# Patient Record
Sex: Male | Born: 1935 | Race: Black or African American | Hispanic: No | Smoking: Former smoker
Health system: Southern US, Community
[De-identification: ages and names within clinical notes are randomized; demographics above are authoritative.]

## PROBLEM LIST (undated history)

## (undated) DIAGNOSIS — I6529 Occlusion and stenosis of unspecified carotid artery: Secondary | ICD-10-CM

## (undated) DIAGNOSIS — I452 Bifascicular block: Secondary | ICD-10-CM

## (undated) DIAGNOSIS — I10 Essential (primary) hypertension: Secondary | ICD-10-CM

## (undated) DIAGNOSIS — N189 Chronic kidney disease, unspecified: Secondary | ICD-10-CM

## (undated) DIAGNOSIS — H3530 Unspecified macular degeneration: Secondary | ICD-10-CM

## (undated) DIAGNOSIS — I639 Cerebral infarction, unspecified: Secondary | ICD-10-CM

## (undated) DIAGNOSIS — N400 Enlarged prostate without lower urinary tract symptoms: Secondary | ICD-10-CM

## (undated) DIAGNOSIS — H269 Unspecified cataract: Secondary | ICD-10-CM

## (undated) HISTORY — DX: Cerebral infarction, unspecified: I63.9

## (undated) HISTORY — DX: Occlusion and stenosis of unspecified carotid artery: I65.29

---

## 1999-11-15 ENCOUNTER — Ambulatory Visit: Admission: RE | Admit: 1999-11-15 | Discharge: 1999-11-15 | Payer: Self-pay | Admitting: *Deleted

## 2002-12-07 ENCOUNTER — Ambulatory Visit (HOSPITAL_COMMUNITY): Admission: RE | Admit: 2002-12-07 | Discharge: 2002-12-07 | Payer: Self-pay | Admitting: *Deleted

## 2011-08-11 ENCOUNTER — Ambulatory Visit (INDEPENDENT_AMBULATORY_CARE_PROVIDER_SITE_OTHER): Payer: Medicare Other | Admitting: Ophthalmology

## 2011-08-11 DIAGNOSIS — H3530 Unspecified macular degeneration: Secondary | ICD-10-CM

## 2011-08-11 DIAGNOSIS — H2510 Age-related nuclear cataract, unspecified eye: Secondary | ICD-10-CM

## 2011-08-11 DIAGNOSIS — H43819 Vitreous degeneration, unspecified eye: Secondary | ICD-10-CM

## 2012-08-11 ENCOUNTER — Ambulatory Visit (INDEPENDENT_AMBULATORY_CARE_PROVIDER_SITE_OTHER): Payer: Medicare Other | Admitting: Ophthalmology

## 2012-08-11 DIAGNOSIS — H43819 Vitreous degeneration, unspecified eye: Secondary | ICD-10-CM

## 2012-08-11 DIAGNOSIS — H2510 Age-related nuclear cataract, unspecified eye: Secondary | ICD-10-CM

## 2012-08-11 DIAGNOSIS — H3530 Unspecified macular degeneration: Secondary | ICD-10-CM

## 2013-08-29 ENCOUNTER — Ambulatory Visit (INDEPENDENT_AMBULATORY_CARE_PROVIDER_SITE_OTHER): Payer: Medicare Other | Admitting: Ophthalmology

## 2013-08-29 DIAGNOSIS — H35039 Hypertensive retinopathy, unspecified eye: Secondary | ICD-10-CM

## 2013-08-29 DIAGNOSIS — H3530 Unspecified macular degeneration: Secondary | ICD-10-CM

## 2013-08-29 DIAGNOSIS — H2510 Age-related nuclear cataract, unspecified eye: Secondary | ICD-10-CM

## 2013-08-29 DIAGNOSIS — I10 Essential (primary) hypertension: Secondary | ICD-10-CM

## 2013-08-29 DIAGNOSIS — H43819 Vitreous degeneration, unspecified eye: Secondary | ICD-10-CM

## 2014-06-28 ENCOUNTER — Encounter (INDEPENDENT_AMBULATORY_CARE_PROVIDER_SITE_OTHER): Payer: Medicare Other | Admitting: Ophthalmology

## 2014-06-28 DIAGNOSIS — H3532 Exudative age-related macular degeneration: Secondary | ICD-10-CM | POA: Diagnosis not present

## 2014-06-28 DIAGNOSIS — H43813 Vitreous degeneration, bilateral: Secondary | ICD-10-CM

## 2014-06-28 DIAGNOSIS — H35033 Hypertensive retinopathy, bilateral: Secondary | ICD-10-CM | POA: Diagnosis not present

## 2014-06-28 DIAGNOSIS — I10 Essential (primary) hypertension: Secondary | ICD-10-CM

## 2014-06-28 DIAGNOSIS — H3531 Nonexudative age-related macular degeneration: Secondary | ICD-10-CM

## 2014-07-24 ENCOUNTER — Encounter (INDEPENDENT_AMBULATORY_CARE_PROVIDER_SITE_OTHER): Payer: Medicare Other | Admitting: Ophthalmology

## 2014-07-24 DIAGNOSIS — I10 Essential (primary) hypertension: Secondary | ICD-10-CM | POA: Diagnosis not present

## 2014-07-24 DIAGNOSIS — H35033 Hypertensive retinopathy, bilateral: Secondary | ICD-10-CM | POA: Diagnosis not present

## 2014-07-24 DIAGNOSIS — H3532 Exudative age-related macular degeneration: Secondary | ICD-10-CM

## 2014-07-24 DIAGNOSIS — H3531 Nonexudative age-related macular degeneration: Secondary | ICD-10-CM | POA: Diagnosis not present

## 2014-07-24 DIAGNOSIS — H43813 Vitreous degeneration, bilateral: Secondary | ICD-10-CM | POA: Diagnosis not present

## 2014-08-21 ENCOUNTER — Encounter (INDEPENDENT_AMBULATORY_CARE_PROVIDER_SITE_OTHER): Payer: Medicare Other | Admitting: Ophthalmology

## 2014-08-21 DIAGNOSIS — H43813 Vitreous degeneration, bilateral: Secondary | ICD-10-CM | POA: Diagnosis not present

## 2014-08-21 DIAGNOSIS — I10 Essential (primary) hypertension: Secondary | ICD-10-CM | POA: Diagnosis not present

## 2014-08-21 DIAGNOSIS — H35033 Hypertensive retinopathy, bilateral: Secondary | ICD-10-CM | POA: Diagnosis not present

## 2014-08-21 DIAGNOSIS — H3531 Nonexudative age-related macular degeneration: Secondary | ICD-10-CM | POA: Diagnosis not present

## 2014-08-21 DIAGNOSIS — H3532 Exudative age-related macular degeneration: Secondary | ICD-10-CM | POA: Diagnosis not present

## 2014-09-11 ENCOUNTER — Ambulatory Visit (INDEPENDENT_AMBULATORY_CARE_PROVIDER_SITE_OTHER): Payer: Medicare Other | Admitting: Ophthalmology

## 2014-09-20 ENCOUNTER — Encounter (INDEPENDENT_AMBULATORY_CARE_PROVIDER_SITE_OTHER): Payer: Medicare Other | Admitting: Ophthalmology

## 2014-09-20 DIAGNOSIS — H43813 Vitreous degeneration, bilateral: Secondary | ICD-10-CM | POA: Diagnosis not present

## 2014-09-20 DIAGNOSIS — I10 Essential (primary) hypertension: Secondary | ICD-10-CM

## 2014-09-20 DIAGNOSIS — H35033 Hypertensive retinopathy, bilateral: Secondary | ICD-10-CM

## 2014-09-20 DIAGNOSIS — H3532 Exudative age-related macular degeneration: Secondary | ICD-10-CM | POA: Diagnosis not present

## 2014-09-20 DIAGNOSIS — H3531 Nonexudative age-related macular degeneration: Secondary | ICD-10-CM | POA: Diagnosis not present

## 2014-10-18 ENCOUNTER — Encounter (INDEPENDENT_AMBULATORY_CARE_PROVIDER_SITE_OTHER): Payer: Medicare Other | Admitting: Ophthalmology

## 2014-10-18 DIAGNOSIS — I10 Essential (primary) hypertension: Secondary | ICD-10-CM

## 2014-10-18 DIAGNOSIS — H3532 Exudative age-related macular degeneration: Secondary | ICD-10-CM | POA: Diagnosis not present

## 2014-10-18 DIAGNOSIS — H3531 Nonexudative age-related macular degeneration: Secondary | ICD-10-CM | POA: Diagnosis not present

## 2014-10-18 DIAGNOSIS — H43813 Vitreous degeneration, bilateral: Secondary | ICD-10-CM

## 2014-10-18 DIAGNOSIS — H35033 Hypertensive retinopathy, bilateral: Secondary | ICD-10-CM | POA: Diagnosis not present

## 2014-11-29 ENCOUNTER — Encounter (INDEPENDENT_AMBULATORY_CARE_PROVIDER_SITE_OTHER): Payer: Medicare Other | Admitting: Ophthalmology

## 2014-11-29 DIAGNOSIS — H35033 Hypertensive retinopathy, bilateral: Secondary | ICD-10-CM | POA: Diagnosis not present

## 2014-11-29 DIAGNOSIS — H3531 Nonexudative age-related macular degeneration: Secondary | ICD-10-CM | POA: Diagnosis not present

## 2014-11-29 DIAGNOSIS — H43813 Vitreous degeneration, bilateral: Secondary | ICD-10-CM

## 2014-11-29 DIAGNOSIS — H3532 Exudative age-related macular degeneration: Secondary | ICD-10-CM | POA: Diagnosis not present

## 2014-11-29 DIAGNOSIS — I10 Essential (primary) hypertension: Secondary | ICD-10-CM

## 2015-01-10 ENCOUNTER — Encounter (INDEPENDENT_AMBULATORY_CARE_PROVIDER_SITE_OTHER): Payer: Medicare Other | Admitting: Ophthalmology

## 2015-01-10 DIAGNOSIS — H43813 Vitreous degeneration, bilateral: Secondary | ICD-10-CM | POA: Diagnosis not present

## 2015-01-10 DIAGNOSIS — I10 Essential (primary) hypertension: Secondary | ICD-10-CM | POA: Diagnosis not present

## 2015-01-10 DIAGNOSIS — H35033 Hypertensive retinopathy, bilateral: Secondary | ICD-10-CM | POA: Diagnosis not present

## 2015-01-10 DIAGNOSIS — H353231 Exudative age-related macular degeneration, bilateral, with active choroidal neovascularization: Secondary | ICD-10-CM

## 2015-02-21 ENCOUNTER — Encounter (INDEPENDENT_AMBULATORY_CARE_PROVIDER_SITE_OTHER): Payer: Medicare Other | Admitting: Ophthalmology

## 2015-02-21 DIAGNOSIS — H35033 Hypertensive retinopathy, bilateral: Secondary | ICD-10-CM | POA: Diagnosis not present

## 2015-02-21 DIAGNOSIS — H43813 Vitreous degeneration, bilateral: Secondary | ICD-10-CM | POA: Diagnosis not present

## 2015-02-21 DIAGNOSIS — H353231 Exudative age-related macular degeneration, bilateral, with active choroidal neovascularization: Secondary | ICD-10-CM

## 2015-02-21 DIAGNOSIS — I10 Essential (primary) hypertension: Secondary | ICD-10-CM | POA: Diagnosis not present

## 2015-04-04 ENCOUNTER — Encounter (INDEPENDENT_AMBULATORY_CARE_PROVIDER_SITE_OTHER): Payer: Medicare Other | Admitting: Ophthalmology

## 2015-04-04 DIAGNOSIS — H35033 Hypertensive retinopathy, bilateral: Secondary | ICD-10-CM | POA: Diagnosis not present

## 2015-04-04 DIAGNOSIS — I10 Essential (primary) hypertension: Secondary | ICD-10-CM

## 2015-04-04 DIAGNOSIS — H43813 Vitreous degeneration, bilateral: Secondary | ICD-10-CM

## 2015-04-04 DIAGNOSIS — H353231 Exudative age-related macular degeneration, bilateral, with active choroidal neovascularization: Secondary | ICD-10-CM | POA: Diagnosis not present

## 2015-05-16 ENCOUNTER — Encounter (INDEPENDENT_AMBULATORY_CARE_PROVIDER_SITE_OTHER): Payer: Medicare Other | Admitting: Ophthalmology

## 2015-05-16 DIAGNOSIS — I10 Essential (primary) hypertension: Secondary | ICD-10-CM | POA: Diagnosis not present

## 2015-05-16 DIAGNOSIS — H353231 Exudative age-related macular degeneration, bilateral, with active choroidal neovascularization: Secondary | ICD-10-CM

## 2015-05-16 DIAGNOSIS — H43813 Vitreous degeneration, bilateral: Secondary | ICD-10-CM | POA: Diagnosis not present

## 2015-05-16 DIAGNOSIS — H35033 Hypertensive retinopathy, bilateral: Secondary | ICD-10-CM

## 2015-06-27 ENCOUNTER — Encounter (INDEPENDENT_AMBULATORY_CARE_PROVIDER_SITE_OTHER): Payer: Medicare Other | Admitting: Ophthalmology

## 2015-06-27 DIAGNOSIS — H43813 Vitreous degeneration, bilateral: Secondary | ICD-10-CM

## 2015-06-27 DIAGNOSIS — H353231 Exudative age-related macular degeneration, bilateral, with active choroidal neovascularization: Secondary | ICD-10-CM

## 2015-06-27 DIAGNOSIS — H35033 Hypertensive retinopathy, bilateral: Secondary | ICD-10-CM

## 2015-06-27 DIAGNOSIS — I10 Essential (primary) hypertension: Secondary | ICD-10-CM

## 2015-08-01 ENCOUNTER — Encounter: Payer: Self-pay | Admitting: Podiatry

## 2015-08-01 ENCOUNTER — Ambulatory Visit (INDEPENDENT_AMBULATORY_CARE_PROVIDER_SITE_OTHER): Payer: Medicare Other | Admitting: Podiatry

## 2015-08-01 VITALS — BP 119/68 | HR 73 | Resp 14

## 2015-08-01 DIAGNOSIS — B351 Tinea unguium: Secondary | ICD-10-CM

## 2015-08-01 DIAGNOSIS — M79676 Pain in unspecified toe(s): Secondary | ICD-10-CM

## 2015-08-01 NOTE — Progress Notes (Signed)
   Subjective:    Patient ID: Christian BrazenJohn W Barner, male    DOB: 01/15/1924, 82 y.o.   MRN: [id number]  HPI this patient presents to my office with chief complaint of long thick painful toenails, especially his big toenails both feet. He states that his nails are painful walking and wearing his shoes. He mentioned to his primary doctor that he was having difficulty taking care of his nails and he referred him to this office for an evaluation. Patient does have a history of surgery on the second toe, left foot. He also has chronic athlete's foot. According to this patient. He presents to the office today for an evaluation and treatment of his feet    Review of Systems  All other systems reviewed and are negative.      Objective:   Physical Exam GENERAL APPEARANCE: Alert, conversant. Appropriately groomed. No acute distress.  VASCULAR: Pedal pulses are  Diminished  at  DP and PT bilateral.  Capillary refill time is immediate to all digits,  Normal temperature gradient.  Digital hair growth is present bilateral  NEUROLOGIC: sensation is normal to 5.07 monofilament at 5/5 sites bilateral.  Light touch is intact bilateral, Muscle strength normal.  MUSCULOSKELETAL: acceptable muscle strength, tone and stability bilateral.  Intrinsic muscluature intact bilateral.  Rectus appearance of foot and digits noted bilateral. Bone from second toe left foot removed.  DERMATOLOGIC: skin color, texture, and turgor are within normal limits.  No preulcerative lesions or ulcers  are seen, no interdigital maceration noted.  No open lesions present.  . No drainage noted.  NAILS  Thick disfigured discolored nails both feet.  No redness or drainage noted.         Assessment & Plan:  Onychomycosis B/L   IE  Debridement of nails B/l  RTC 3 months   Helane GuntherGregory Erynne Kealey DPM

## 2015-08-15 ENCOUNTER — Encounter (INDEPENDENT_AMBULATORY_CARE_PROVIDER_SITE_OTHER): Payer: Medicare Other | Admitting: Ophthalmology

## 2015-08-15 DIAGNOSIS — I10 Essential (primary) hypertension: Secondary | ICD-10-CM | POA: Diagnosis not present

## 2015-08-15 DIAGNOSIS — H43813 Vitreous degeneration, bilateral: Secondary | ICD-10-CM

## 2015-08-15 DIAGNOSIS — H353231 Exudative age-related macular degeneration, bilateral, with active choroidal neovascularization: Secondary | ICD-10-CM

## 2015-08-15 DIAGNOSIS — H35033 Hypertensive retinopathy, bilateral: Secondary | ICD-10-CM | POA: Diagnosis not present

## 2015-10-10 ENCOUNTER — Encounter (INDEPENDENT_AMBULATORY_CARE_PROVIDER_SITE_OTHER): Payer: Medicare Other | Admitting: Ophthalmology

## 2015-10-10 DIAGNOSIS — H43813 Vitreous degeneration, bilateral: Secondary | ICD-10-CM | POA: Diagnosis not present

## 2015-10-10 DIAGNOSIS — H35033 Hypertensive retinopathy, bilateral: Secondary | ICD-10-CM | POA: Diagnosis not present

## 2015-10-10 DIAGNOSIS — H353231 Exudative age-related macular degeneration, bilateral, with active choroidal neovascularization: Secondary | ICD-10-CM

## 2015-10-10 DIAGNOSIS — I10 Essential (primary) hypertension: Secondary | ICD-10-CM | POA: Diagnosis not present

## 2015-10-31 ENCOUNTER — Ambulatory Visit (INDEPENDENT_AMBULATORY_CARE_PROVIDER_SITE_OTHER): Payer: Medicare Other | Admitting: Podiatry

## 2015-10-31 ENCOUNTER — Encounter: Payer: Self-pay | Admitting: Podiatry

## 2015-10-31 DIAGNOSIS — B351 Tinea unguium: Secondary | ICD-10-CM | POA: Diagnosis not present

## 2015-10-31 DIAGNOSIS — M79676 Pain in unspecified toe(s): Secondary | ICD-10-CM | POA: Diagnosis not present

## 2015-10-31 NOTE — Progress Notes (Signed)
Complaint:  Visit Type: Patient returns to my office for continued preventative foot care services. Complaint: Patient states" my nails have grown long and thick and become painful to walk and wear shoes" . The patient presents for preventative foot care services. No changes to ROS  Podiatric Exam: Vascular: dorsalis pedis and posterior tibial pulses are palpable bilateral. Capillary return is immediate. Temperature gradient is WNL. Skin turgor WNL  Sensorium: Normal Semmes Weinstein monofilament test. Normal tactile sensation bilaterally. Nail Exam: Pt has thick disfigured discolored nails with subungual debris noted bilateral entire nail hallux through fifth toenails Ulcer Exam: There is no evidence of ulcer or pre-ulcerative changes or infection. Orthopedic Exam: Muscle tone and strength are WNL. No limitations in general ROM. No crepitus or effusions noted. Foot type and digits show no abnormalities. Bony prominences are unremarkable. Skin: No Porokeratosis. No infection or ulcers  Diagnosis:  Onychomycosis, , Pain in right toe, pain in left toes  Treatment & Plan Procedures and Treatment: Consent by patient was obtained for treatment procedures. The patient understood the discussion of treatment and procedures well. All questions were answered thoroughly reviewed. Debridement of mycotic and hypertrophic toenails, 1 through 5 bilateral and clearing of subungual debris. No ulceration, no infection noted.  Return Visit-Office Procedure: Patient instructed to return to the office for a follow up visit 3 months   for continued evaluation and treatment.    Uziel Covault DPM 

## 2015-12-05 ENCOUNTER — Encounter (INDEPENDENT_AMBULATORY_CARE_PROVIDER_SITE_OTHER): Payer: Medicare Other | Admitting: Ophthalmology

## 2015-12-05 DIAGNOSIS — H353231 Exudative age-related macular degeneration, bilateral, with active choroidal neovascularization: Secondary | ICD-10-CM | POA: Diagnosis not present

## 2015-12-05 DIAGNOSIS — H35033 Hypertensive retinopathy, bilateral: Secondary | ICD-10-CM

## 2015-12-05 DIAGNOSIS — H43813 Vitreous degeneration, bilateral: Secondary | ICD-10-CM

## 2015-12-05 DIAGNOSIS — I10 Essential (primary) hypertension: Secondary | ICD-10-CM

## 2016-01-30 ENCOUNTER — Encounter (INDEPENDENT_AMBULATORY_CARE_PROVIDER_SITE_OTHER): Payer: Medicare Other | Admitting: Ophthalmology

## 2016-01-30 ENCOUNTER — Ambulatory Visit: Payer: Medicare Other | Admitting: Podiatry

## 2016-01-30 DIAGNOSIS — H353231 Exudative age-related macular degeneration, bilateral, with active choroidal neovascularization: Secondary | ICD-10-CM

## 2016-01-30 DIAGNOSIS — H43813 Vitreous degeneration, bilateral: Secondary | ICD-10-CM | POA: Diagnosis not present

## 2016-01-30 DIAGNOSIS — H35033 Hypertensive retinopathy, bilateral: Secondary | ICD-10-CM

## 2016-01-30 DIAGNOSIS — I10 Essential (primary) hypertension: Secondary | ICD-10-CM

## 2016-02-07 ENCOUNTER — Encounter: Payer: Self-pay | Admitting: Podiatry

## 2016-02-07 ENCOUNTER — Ambulatory Visit (INDEPENDENT_AMBULATORY_CARE_PROVIDER_SITE_OTHER): Payer: Medicare Other | Admitting: Podiatry

## 2016-02-07 VITALS — Ht 70.0 in | Wt 172.0 lb

## 2016-02-07 DIAGNOSIS — M79676 Pain in unspecified toe(s): Secondary | ICD-10-CM | POA: Diagnosis not present

## 2016-02-07 DIAGNOSIS — B351 Tinea unguium: Secondary | ICD-10-CM | POA: Diagnosis not present

## 2016-02-07 DIAGNOSIS — W450XXA Nail entering through skin, initial encounter: Secondary | ICD-10-CM

## 2016-02-07 NOTE — Progress Notes (Signed)
Complaint:  Visit Type: Patient returns to my office for continued preventative foot care services. Complaint: Patient states" my nails have grown long and thick and become painful to walk and wear shoes" . The patient presents for preventative foot care services. No changes to ROS.  Patient says he injures his left big toenail 2 weeks ago.  Podiatric Exam: Vascular: dorsalis pedis and posterior tibial pulses are palpable bilateral. Capillary return is immediate. Temperature gradient is WNL. Skin turgor WNL  Sensorium: Normal Semmes Weinstein monofilament test. Normal tactile sensation bilaterally. Nail Exam: Pt has thick disfigured discolored nails with subungual debris noted bilateral entire nail hallux through fifth toenails.  There is blackened hematoma left hallux nail with nail lifting but intact proxinmal nail fold.  No redness or infection or drainage. Ulcer Exam: There is no evidence of ulcer or pre-ulcerative changes or infection. Orthopedic Exam: Muscle tone and strength are WNL. No limitations in general ROM. No crepitus or effusions noted. Foot type and digits show no abnormalities. Bony prominences are unremarkable. Skin: No Porokeratosis. No infection or ulcers  Diagnosis:  Onychomycosis, , Pain in right toe, pain in left toes  Treatment & Plan Procedures and Treatment: Consent by patient was obtained for treatment procedures. The patient understood the discussion of treatment and procedures well. All questions were answered thoroughly reviewed. Debridement of mycotic and hypertrophic toenails, 1 through 5 bilateral and clearing of subungual debris. No ulceration, no infection noted.  Return Visit-Office Procedure: Patient instructed to return to the office for a follow up visit 3 months for continued evaluation and treatment.    Helane GuntherGregory Chrissie Dacquisto DPM

## 2016-03-26 ENCOUNTER — Encounter (INDEPENDENT_AMBULATORY_CARE_PROVIDER_SITE_OTHER): Payer: Medicare Other | Admitting: Ophthalmology

## 2016-03-26 DIAGNOSIS — H353231 Exudative age-related macular degeneration, bilateral, with active choroidal neovascularization: Secondary | ICD-10-CM

## 2016-03-26 DIAGNOSIS — H35033 Hypertensive retinopathy, bilateral: Secondary | ICD-10-CM

## 2016-03-26 DIAGNOSIS — I10 Essential (primary) hypertension: Secondary | ICD-10-CM | POA: Diagnosis not present

## 2016-03-26 DIAGNOSIS — H43813 Vitreous degeneration, bilateral: Secondary | ICD-10-CM

## 2016-05-01 ENCOUNTER — Encounter: Payer: Self-pay | Admitting: Podiatry

## 2016-05-01 ENCOUNTER — Ambulatory Visit (INDEPENDENT_AMBULATORY_CARE_PROVIDER_SITE_OTHER): Payer: Medicare Other | Admitting: Podiatry

## 2016-05-01 VITALS — Ht 70.0 in | Wt 172.0 lb

## 2016-05-01 DIAGNOSIS — B351 Tinea unguium: Secondary | ICD-10-CM | POA: Diagnosis not present

## 2016-05-01 DIAGNOSIS — M79676 Pain in unspecified toe(s): Secondary | ICD-10-CM | POA: Diagnosis not present

## 2016-05-01 NOTE — Progress Notes (Signed)
Complaint:  Visit Type: Patient returns to my office for continued preventative foot care services. Complaint: Patient states" my nails have grown long and thick and become painful to walk and wear shoes" . The patient presents for preventative foot care services. No changes to ROS.  Patient says he injures his left big toenail 2 weeks ago.  Podiatric Exam: Vascular: dorsalis pedis and posterior tibial pulses are palpable bilateral. Capillary return is immediate. Temperature gradient is WNL. Skin turgor WNL  Sensorium: Normal Semmes Weinstein monofilament test. Normal tactile sensation bilaterally. Nail Exam: Pt has thick disfigured discolored nails with subungual debris noted bilateral entire nail hallux through fifth toenails.  There is blackened hematoma left hallux nail with nail lifting but intact proxinmal nail fold.  No redness or infection or drainage. Ulcer Exam: There is no evidence of ulcer or pre-ulcerative changes or infection. Orthopedic Exam: Muscle tone and strength are WNL. No limitations in general ROM. No crepitus or effusions noted. Foot type and digits show no abnormalities. Bony prominences are unremarkable. Skin: No Porokeratosis. No infection or ulcers  Diagnosis:  Onychomycosis, , Pain in right toe, pain in left toes  Treatment & Plan Procedures and Treatment: Consent by patient was obtained for treatment procedures. The patient understood the discussion of treatment and procedures well. All questions were answered thoroughly reviewed. Debridement of mycotic and hypertrophic toenails, 1 through 5 bilateral and clearing of subungual debris. No ulceration, no infection noted.  Return Visit-Office Procedure: Patient instructed to return to the office for a follow up visit 3 months for continued evaluation and treatment.    Danniela Mcbrearty DPM 

## 2016-05-21 ENCOUNTER — Encounter (INDEPENDENT_AMBULATORY_CARE_PROVIDER_SITE_OTHER): Payer: Medicare Other | Admitting: Ophthalmology

## 2016-05-21 DIAGNOSIS — H43813 Vitreous degeneration, bilateral: Secondary | ICD-10-CM | POA: Diagnosis not present

## 2016-05-21 DIAGNOSIS — I10 Essential (primary) hypertension: Secondary | ICD-10-CM | POA: Diagnosis not present

## 2016-05-21 DIAGNOSIS — H353231 Exudative age-related macular degeneration, bilateral, with active choroidal neovascularization: Secondary | ICD-10-CM

## 2016-05-21 DIAGNOSIS — H35033 Hypertensive retinopathy, bilateral: Secondary | ICD-10-CM | POA: Diagnosis not present

## 2016-07-16 ENCOUNTER — Encounter (INDEPENDENT_AMBULATORY_CARE_PROVIDER_SITE_OTHER): Payer: Medicare Other | Admitting: Ophthalmology

## 2016-07-16 DIAGNOSIS — I10 Essential (primary) hypertension: Secondary | ICD-10-CM

## 2016-07-16 DIAGNOSIS — H43813 Vitreous degeneration, bilateral: Secondary | ICD-10-CM

## 2016-07-16 DIAGNOSIS — H353231 Exudative age-related macular degeneration, bilateral, with active choroidal neovascularization: Secondary | ICD-10-CM

## 2016-07-16 DIAGNOSIS — H35033 Hypertensive retinopathy, bilateral: Secondary | ICD-10-CM | POA: Diagnosis not present

## 2016-08-06 ENCOUNTER — Encounter: Payer: Self-pay | Admitting: Podiatry

## 2016-08-06 ENCOUNTER — Ambulatory Visit (INDEPENDENT_AMBULATORY_CARE_PROVIDER_SITE_OTHER): Payer: Medicare Other | Admitting: Podiatry

## 2016-08-06 DIAGNOSIS — M79676 Pain in unspecified toe(s): Secondary | ICD-10-CM

## 2016-08-06 DIAGNOSIS — B351 Tinea unguium: Secondary | ICD-10-CM

## 2016-08-06 NOTE — Progress Notes (Signed)
Complaint:  Visit Type: Patient returns to my office for continued preventative foot care services. Complaint: Patient states" my nails have grown long and thick and become painful to walk and wear shoes" . The patient presents for preventative foot care services.   Podiatric Exam: Vascular: dorsalis pedis and posterior tibial pulses are palpable bilateral. Capillary return is immediate. Temperature gradient is WNL. Skin turgor WNL  Sensorium: Normal Semmes Weinstein monofilament test. Normal tactile sensation bilaterally. Nail Exam: Pt has thick disfigured discolored nails with subungual debris noted bilateral entire nail hallux through fifth toenails.  There is blackened hematoma left hallux nail with nail lifting but intact proxinmal nail fold.  No redness or infection or drainage. Ulcer Exam: There is no evidence of ulcer or pre-ulcerative changes or infection. Orthopedic Exam: Muscle tone and strength are WNL. No limitations in general ROM. No crepitus or effusions noted. Foot type and digits show no abnormalities. Bony prominences are unremarkable. Skin: No Porokeratosis. No infection or ulcers  Diagnosis:  Onychomycosis, , Pain in right toe, pain in left toes  Treatment & Plan Procedures and Treatment: Consent by patient was obtained for treatment procedures. The patient understood the discussion of treatment and procedures well. All questions were answered thoroughly reviewed. Debridement of mycotic and hypertrophic toenails, 1 through 5 bilateral and clearing of subungual debris. No ulceration, no infection noted.  Return Visit-Office Procedure: Patient instructed to return to the office for a follow up visit 3 months for continued evaluation and treatment.    Mariely Mahr DPM 

## 2016-09-10 ENCOUNTER — Encounter (INDEPENDENT_AMBULATORY_CARE_PROVIDER_SITE_OTHER): Payer: Medicare Other | Admitting: Ophthalmology

## 2016-09-10 DIAGNOSIS — I10 Essential (primary) hypertension: Secondary | ICD-10-CM | POA: Diagnosis not present

## 2016-09-10 DIAGNOSIS — H353231 Exudative age-related macular degeneration, bilateral, with active choroidal neovascularization: Secondary | ICD-10-CM | POA: Diagnosis not present

## 2016-09-10 DIAGNOSIS — H43813 Vitreous degeneration, bilateral: Secondary | ICD-10-CM | POA: Diagnosis not present

## 2016-09-10 DIAGNOSIS — H35033 Hypertensive retinopathy, bilateral: Secondary | ICD-10-CM | POA: Diagnosis not present

## 2016-11-11 ENCOUNTER — Ambulatory Visit (INDEPENDENT_AMBULATORY_CARE_PROVIDER_SITE_OTHER): Payer: Medicare Other | Admitting: Podiatry

## 2016-11-11 ENCOUNTER — Encounter: Payer: Self-pay | Admitting: Podiatry

## 2016-11-11 DIAGNOSIS — B351 Tinea unguium: Secondary | ICD-10-CM

## 2016-11-11 DIAGNOSIS — M79676 Pain in unspecified toe(s): Secondary | ICD-10-CM | POA: Diagnosis not present

## 2016-11-11 NOTE — Progress Notes (Signed)
Complaint:  Visit Type: Patient returns to my office for continued preventative foot care services. Complaint: Patient states" my nails have grown long and thick and become painful to walk and wear shoes" . The patient presents for preventative foot care services.   Podiatric Exam: Vascular: dorsalis pedis and posterior tibial pulses are palpable bilateral. Capillary return is immediate. Temperature gradient is WNL. Skin turgor WNL  Sensorium: Normal Semmes Weinstein monofilament test. Normal tactile sensation bilaterally. Nail Exam: Pt has thick disfigured discolored nails with subungual debris noted bilateral entire nail hallux through fifth toenails.  There is blackened hematoma left hallux nail with nail lifting but intact proxinmal nail fold.  No redness or infection or drainage. Ulcer Exam: There is no evidence of ulcer or pre-ulcerative changes or infection. Orthopedic Exam: Muscle tone and strength are WNL. No limitations in general ROM. No crepitus or effusions noted. Foot type and digits show no abnormalities. Bony prominences are unremarkable. Skin: No Porokeratosis. No infection or ulcers  Diagnosis:  Onychomycosis, , Pain in right toe, pain in left toes  Treatment & Plan Procedures and Treatment: Consent by patient was obtained for treatment procedures. The patient understood the discussion of treatment and procedures well. All questions were answered thoroughly reviewed. Debridement of mycotic and hypertrophic toenails, 1 through 5 bilateral and clearing of subungual debris. No ulceration, no infection noted.  Return Visit-Office Procedure: Patient instructed to return to the office for a follow up visit 3 months for continued evaluation and treatment.    Helane GuntherGregory Savier Trickett DPM

## 2016-11-12 ENCOUNTER — Encounter (INDEPENDENT_AMBULATORY_CARE_PROVIDER_SITE_OTHER): Payer: Medicare Other | Admitting: Ophthalmology

## 2016-11-12 DIAGNOSIS — I10 Essential (primary) hypertension: Secondary | ICD-10-CM

## 2016-11-12 DIAGNOSIS — H353231 Exudative age-related macular degeneration, bilateral, with active choroidal neovascularization: Secondary | ICD-10-CM

## 2016-11-12 DIAGNOSIS — H43813 Vitreous degeneration, bilateral: Secondary | ICD-10-CM | POA: Diagnosis not present

## 2016-11-12 DIAGNOSIS — H35033 Hypertensive retinopathy, bilateral: Secondary | ICD-10-CM | POA: Diagnosis not present

## 2017-01-21 ENCOUNTER — Encounter (INDEPENDENT_AMBULATORY_CARE_PROVIDER_SITE_OTHER): Payer: Medicare Other | Admitting: Ophthalmology

## 2017-01-21 DIAGNOSIS — I10 Essential (primary) hypertension: Secondary | ICD-10-CM

## 2017-01-21 DIAGNOSIS — H35033 Hypertensive retinopathy, bilateral: Secondary | ICD-10-CM

## 2017-01-21 DIAGNOSIS — H43813 Vitreous degeneration, bilateral: Secondary | ICD-10-CM

## 2017-01-21 DIAGNOSIS — H353231 Exudative age-related macular degeneration, bilateral, with active choroidal neovascularization: Secondary | ICD-10-CM

## 2017-02-10 ENCOUNTER — Ambulatory Visit: Payer: Medicare Other | Admitting: Podiatry

## 2017-03-11 ENCOUNTER — Ambulatory Visit: Payer: Medicare Other | Admitting: Podiatry

## 2017-04-08 ENCOUNTER — Encounter (INDEPENDENT_AMBULATORY_CARE_PROVIDER_SITE_OTHER): Payer: Medicare Other | Admitting: Ophthalmology

## 2017-04-08 DIAGNOSIS — I10 Essential (primary) hypertension: Secondary | ICD-10-CM

## 2017-04-08 DIAGNOSIS — H353231 Exudative age-related macular degeneration, bilateral, with active choroidal neovascularization: Secondary | ICD-10-CM | POA: Diagnosis not present

## 2017-04-08 DIAGNOSIS — H43813 Vitreous degeneration, bilateral: Secondary | ICD-10-CM

## 2017-04-08 DIAGNOSIS — H35033 Hypertensive retinopathy, bilateral: Secondary | ICD-10-CM

## 2017-04-14 ENCOUNTER — Ambulatory Visit (INDEPENDENT_AMBULATORY_CARE_PROVIDER_SITE_OTHER): Payer: Medicare Other | Admitting: Podiatry

## 2017-04-14 ENCOUNTER — Encounter: Payer: Self-pay | Admitting: Podiatry

## 2017-04-14 DIAGNOSIS — M79676 Pain in unspecified toe(s): Secondary | ICD-10-CM | POA: Diagnosis not present

## 2017-04-14 DIAGNOSIS — B351 Tinea unguium: Secondary | ICD-10-CM

## 2017-04-14 NOTE — Progress Notes (Signed)
Complaint:  Visit Type: Patient returns to my office for continued preventative foot care services. Complaint: Patient states" my nails have grown long and thick and become painful to walk and wear shoes" . The patient presents for preventative foot care services.   Podiatric Exam: Vascular: dorsalis pedis and posterior tibial pulses are palpable bilateral. Capillary return is immediate. Temperature gradient is WNL. Skin turgor WNL  Sensorium: Normal Semmes Weinstein monofilament test. Normal tactile sensation bilaterally. Nail Exam: Pt has thick disfigured discolored nails with subungual debris noted bilateral entire nail hallux through fifth toenails.    No redness or infection or drainage. Ulcer Exam: There is no evidence of ulcer or pre-ulcerative changes or infection. Orthopedic Exam: Muscle tone and strength are WNL. No limitations in general ROM. No crepitus or effusions noted. Foot type and digits show no abnormalities. Bony prominences are unremarkable. Floating second toe left foot. Skin: No Porokeratosis. No infection or ulcers  Diagnosis:  Onychomycosis, , Pain in right toe, pain in left toes  Treatment & Plan Procedures and Treatment: Consent by patient was obtained for treatment procedures. The patient understood the discussion of treatment and procedures well. All questions were answered thoroughly reviewed. Debridement of mycotic and hypertrophic toenails, 1 through 5 bilateral and clearing of subungual debris. No ulceration, no infection noted.  Return Visit-Office Procedure: Patient instructed to return to the office for a follow up visit 3 months for continued evaluation and treatment.    Panhia Karl DPM 

## 2017-06-19 ENCOUNTER — Inpatient Hospital Stay (HOSPITAL_COMMUNITY)
Admission: EM | Admit: 2017-06-19 | Discharge: 2017-06-22 | DRG: 065 | Disposition: A | Discharge status: Home / Self Care | Payer: Medicare Other | Attending: Family Medicine | Admitting: Family Medicine

## 2017-06-19 ENCOUNTER — Emergency Department (HOSPITAL_COMMUNITY): Payer: Medicare Other

## 2017-06-19 ENCOUNTER — Encounter (HOSPITAL_COMMUNITY): Payer: Self-pay

## 2017-06-19 ENCOUNTER — Other Ambulatory Visit: Payer: Self-pay

## 2017-06-19 DIAGNOSIS — I6521 Occlusion and stenosis of right carotid artery: Secondary | ICD-10-CM | POA: Diagnosis present

## 2017-06-19 DIAGNOSIS — Z887 Allergy status to serum and vaccine status: Secondary | ICD-10-CM

## 2017-06-19 DIAGNOSIS — N400 Enlarged prostate without lower urinary tract symptoms: Secondary | ICD-10-CM | POA: Diagnosis present

## 2017-06-19 DIAGNOSIS — Z974 Presence of external hearing-aid: Secondary | ICD-10-CM

## 2017-06-19 DIAGNOSIS — Z87891 Personal history of nicotine dependence: Secondary | ICD-10-CM

## 2017-06-19 DIAGNOSIS — Z7982 Long term (current) use of aspirin: Secondary | ICD-10-CM

## 2017-06-19 DIAGNOSIS — I639 Cerebral infarction, unspecified: Secondary | ICD-10-CM | POA: Diagnosis present

## 2017-06-19 DIAGNOSIS — E119 Type 2 diabetes mellitus without complications: Secondary | ICD-10-CM

## 2017-06-19 DIAGNOSIS — G459 Transient cerebral ischemic attack, unspecified: Secondary | ICD-10-CM | POA: Diagnosis not present

## 2017-06-19 DIAGNOSIS — R29700 NIHSS score 0: Secondary | ICD-10-CM | POA: Diagnosis present

## 2017-06-19 DIAGNOSIS — I10 Essential (primary) hypertension: Secondary | ICD-10-CM | POA: Diagnosis present

## 2017-06-19 DIAGNOSIS — H9190 Unspecified hearing loss, unspecified ear: Secondary | ICD-10-CM | POA: Diagnosis present

## 2017-06-19 DIAGNOSIS — Z79890 Hormone replacement therapy (postmenopausal): Secondary | ICD-10-CM

## 2017-06-19 DIAGNOSIS — E1122 Type 2 diabetes mellitus with diabetic chronic kidney disease: Secondary | ICD-10-CM | POA: Diagnosis present

## 2017-06-19 DIAGNOSIS — H3530 Unspecified macular degeneration: Secondary | ICD-10-CM | POA: Diagnosis present

## 2017-06-19 DIAGNOSIS — G8191 Hemiplegia, unspecified affecting right dominant side: Secondary | ICD-10-CM | POA: Diagnosis present

## 2017-06-19 DIAGNOSIS — I129 Hypertensive chronic kidney disease with stage 1 through stage 4 chronic kidney disease, or unspecified chronic kidney disease: Secondary | ICD-10-CM | POA: Diagnosis present

## 2017-06-19 DIAGNOSIS — I452 Bifascicular block: Secondary | ICD-10-CM | POA: Diagnosis present

## 2017-06-19 DIAGNOSIS — N183 Chronic kidney disease, stage 3 unspecified: Secondary | ICD-10-CM | POA: Diagnosis present

## 2017-06-19 DIAGNOSIS — I4510 Unspecified right bundle-branch block: Secondary | ICD-10-CM | POA: Diagnosis present

## 2017-06-19 DIAGNOSIS — J45909 Unspecified asthma, uncomplicated: Secondary | ICD-10-CM | POA: Diagnosis present

## 2017-06-19 DIAGNOSIS — Z79899 Other long term (current) drug therapy: Secondary | ICD-10-CM

## 2017-06-19 DIAGNOSIS — I63132 Cerebral infarction due to embolism of left carotid artery: Principal | ICD-10-CM | POA: Diagnosis present

## 2017-06-19 DIAGNOSIS — I7389 Other specified peripheral vascular diseases: Secondary | ICD-10-CM | POA: Diagnosis present

## 2017-06-19 DIAGNOSIS — E039 Hypothyroidism, unspecified: Secondary | ICD-10-CM | POA: Diagnosis present

## 2017-06-19 DIAGNOSIS — I6522 Occlusion and stenosis of left carotid artery: Secondary | ICD-10-CM

## 2017-06-19 HISTORY — DX: Unspecified macular degeneration: H35.30

## 2017-06-19 HISTORY — DX: Unspecified cataract: H26.9

## 2017-06-19 HISTORY — DX: Chronic kidney disease, unspecified: N18.9

## 2017-06-19 HISTORY — DX: Essential (primary) hypertension: I10

## 2017-06-19 HISTORY — DX: Bifascicular block: I45.2

## 2017-06-19 HISTORY — DX: Benign prostatic hyperplasia without lower urinary tract symptoms: N40.0

## 2017-06-19 LAB — CBC
HEMATOCRIT: 40.9 % (ref 39.0–52.0)
Hemoglobin: 14.3 g/dL (ref 13.0–17.0)
MCH: 30.4 pg (ref 26.0–34.0)
MCHC: 35 g/dL (ref 30.0–36.0)
MCV: 86.8 fL (ref 78.0–100.0)
PLATELETS: 152 10*3/uL (ref 150–400)
RBC: 4.71 MIL/uL (ref 4.22–5.81)
RDW: 13.1 % (ref 11.5–15.5)
WBC: 6.6 10*3/uL (ref 4.0–10.5)

## 2017-06-19 LAB — URINALYSIS, ROUTINE W REFLEX MICROSCOPIC
Bilirubin Urine: NEGATIVE
GLUCOSE, UA: NEGATIVE mg/dL
HGB URINE DIPSTICK: NEGATIVE
KETONES UR: NEGATIVE mg/dL
LEUKOCYTES UA: NEGATIVE
Nitrite: NEGATIVE
PH: 7 (ref 5.0–8.0)
PROTEIN: NEGATIVE mg/dL
Specific Gravity, Urine: 1.011 (ref 1.005–1.030)

## 2017-06-19 LAB — HEPATIC FUNCTION PANEL
ALK PHOS: 45 U/L (ref 38–126)
ALT: 15 U/L — AB (ref 17–63)
AST: 30 U/L (ref 15–41)
Albumin: 3.9 g/dL (ref 3.5–5.0)
BILIRUBIN INDIRECT: 1.4 mg/dL — AB (ref 0.3–0.9)
Bilirubin, Direct: 0.4 mg/dL (ref 0.1–0.5)
TOTAL PROTEIN: 7.5 g/dL (ref 6.5–8.1)
Total Bilirubin: 1.8 mg/dL — ABNORMAL HIGH (ref 0.3–1.2)

## 2017-06-19 LAB — TSH: TSH: 2.271 u[IU]/mL (ref 0.350–4.500)

## 2017-06-19 LAB — CBG MONITORING, ED: Glucose-Capillary: 90 mg/dL (ref 65–99)

## 2017-06-19 LAB — BASIC METABOLIC PANEL
Anion gap: 10 (ref 5–15)
BUN: 19 mg/dL (ref 6–20)
CHLORIDE: 104 mmol/L (ref 101–111)
CO2: 25 mmol/L (ref 22–32)
CREATININE: 1.41 mg/dL — AB (ref 0.61–1.24)
Calcium: 9.3 mg/dL (ref 8.9–10.3)
GFR calc Af Amer: 49 mL/min — ABNORMAL LOW (ref >60)
GFR calc non Af Amer: 42 mL/min — ABNORMAL LOW (ref >60)
Glucose, Bld: 95 mg/dL (ref 65–99)
POTASSIUM: 3.6 mmol/L (ref 3.5–5.1)
Sodium: 139 mmol/L (ref 135–145)

## 2017-06-19 LAB — TROPONIN I: Troponin I: <0.03 ng/mL (ref <0.03)

## 2017-06-19 MED ORDER — LEVOTHYROXINE SODIUM 25 MCG PO TABS
25.0000 ug | ORAL_TABLET | Freq: Every day | ORAL | Status: DC
  Administered 2017-06-20 – 2017-06-22 (×3): 25 ug via ORAL
  Filled 2017-06-19 (×3): qty 1

## 2017-06-19 MED ORDER — ASPIRIN 325 MG PO TABS
325.0000 mg | ORAL_TABLET | Freq: Every day | ORAL | Status: DC
  Administered 2017-06-20: 325 mg via ORAL
  Filled 2017-06-19: qty 1

## 2017-06-19 MED ORDER — SENNOSIDES-DOCUSATE SODIUM 8.6-50 MG PO TABS: 1.0000 | ORAL_TABLET | Freq: Every evening | ORAL | Status: DC | PRN

## 2017-06-19 MED ORDER — HYDROCHLOROTHIAZIDE 25 MG PO TABS: 25.0000 mg | ORAL_TABLET | Freq: Every day | ORAL | Status: DC

## 2017-06-19 MED ORDER — ACETAMINOPHEN 160 MG/5ML PO SOLN: 650.0000 mg | ORAL | Status: DC | PRN

## 2017-06-19 MED ORDER — ENOXAPARIN SODIUM 40 MG/0.4ML ~~LOC~~ SOLN
40.0000 mg | SUBCUTANEOUS | Status: DC
  Administered 2017-06-20 – 2017-06-22 (×3): 40 mg via SUBCUTANEOUS
  Filled 2017-06-19 (×3): qty 0.4

## 2017-06-19 MED ORDER — ACETAMINOPHEN 650 MG RE SUPP: 650.0000 mg | RECTAL | Status: DC | PRN

## 2017-06-19 MED ORDER — VITAMIN D 1000 UNITS PO TABS
1000.0000 [IU] | ORAL_TABLET | Freq: Every day | ORAL | Status: DC
  Administered 2017-06-20 – 2017-06-21 (×2): 1000 [IU] via ORAL
  Filled 2017-06-19 (×2): qty 1

## 2017-06-19 MED ORDER — TAMSULOSIN HCL 0.4 MG PO CAPS
0.4000 mg | ORAL_CAPSULE | Freq: Every day | ORAL | Status: DC
  Administered 2017-06-20 – 2017-06-21 (×3): 0.4 mg via ORAL
  Filled 2017-06-19 (×3): qty 1

## 2017-06-19 MED ORDER — ASPIRIN 300 MG RE SUPP: 300.0000 mg | Freq: Every day | RECTAL | Status: DC

## 2017-06-19 MED ORDER — STROKE: EARLY STAGES OF RECOVERY BOOK
Freq: Once | Status: AC
  Administered 2017-06-20
  Filled 2017-06-19: qty 1

## 2017-06-19 MED ORDER — MONTELUKAST SODIUM 10 MG PO TABS
10.0000 mg | ORAL_TABLET | Freq: Every day | ORAL | Status: DC
  Administered 2017-06-20 – 2017-06-21 (×3): 10 mg via ORAL
  Filled 2017-06-19 (×3): qty 1

## 2017-06-19 MED ORDER — ACETAMINOPHEN 325 MG PO TABS: 650.0000 mg | ORAL_TABLET | ORAL | Status: DC | PRN

## 2017-06-19 NOTE — ED Notes (Signed)
Wife Alona BeneJoyce phone numbers  H - [phone] C [phone]

## 2017-06-19 NOTE — ED Triage Notes (Signed)
[facility]- pt coming from home with complaint of generalized weakness, altered mental status per family. Pt lost his daughter last weekend and has been having intermittent sadness and weakness. Pt aoX4 on arrival.   144/81, HR 96, SPO2 97% ra, CBG 127.

## 2017-06-19 NOTE — H&P (Signed)
History and Physical    Christian BrazenJohn W [name] JYN:[id number] DOB: Sep 14, 1923 DOA: 06/19/2017  PCP: Clide DalesWright, Morgan Dionne, PA  Patient coming from: Home  I [facility] personally briefly [facility]  Chief Complaint: Weakness  HPI: Christian [name] is a 84 y.o. male with medical history significant of HTN, DM2, CKD.  Patient presents to the ED with AMS, weakness.  Patients wife describes episode of R arm and R leg weakness when they were sitting down to eat at 11am.  EMS called, wanted to transport patient then but he refused.  Confusion got worse as day went on but weakness actually improved.  Brought to ED.   ED Course: AAOx4, weakness resolved.  EDP concerned for TIA.   Review of Systems: As per HPI otherwise 10 point review of systems negative.   Past Medical History:  Diagnosis Date  . BPH (benign prostatic hyperplasia)   . Cataract   . CKD (chronic kidney disease)   . DM2 (diabetes mellitus, type 2) (HCC)   . HTN (hypertension)   . Macular degeneration (senile) of retina   . RBBB with left anterior fascicular block     History reviewed. No pertinent surgical history.   reports that he has quit smoking. He has never used smokeless tobacco. He reports that he does not drink alcohol or use drugs.  Allergies  Allergen Reactions  . Tetanus Toxoid, Adsorbed Other (See Comments)    Patient doesn't recall reaction  . Tetanus Toxoids Other (See Comments)    Patient doesn't recall reaction    History reviewed. No pertinent family history. Daughter died last week.  Prior to Admission medications   Medication Sig Start Date End Date Taking? Authorizing Provider  aspirin EC 81 MG tablet Take 81 mg by mouth at bedtime.   Yes [provider]  Calcium Carb-Cholecalciferol (CALCIUM 500+D3 PO) Take 1 tablet by mouth daily.    Yes [provider]  Cholecalciferol (VITAMIN D3) 1000 units CAPS Take 1,000 Units by mouth at bedtime.    Yes [provider]  hydrochlorothiazide (HYDRODIURIL) 25 MG tablet Take 25 mg by mouth daily.  05/22/15  Yes [provider]  levothyroxine (SYNTHROID, LEVOTHROID) 25 MCG tablet Take 25 mcg by mouth daily before breakfast. 06/15/17  Yes [provider]  montelukast (SINGULAIR) 10 MG tablet Take 10 mg by mouth at bedtime. 05/27/17  Yes [provider]  Probiotic Product (PROBIOTIC DAILY PO) Take 1 capsule by mouth daily.    Yes [provider]  tamsulosin (FLOMAX) 0.4 MG CAPS capsule Take 0.4 mg by mouth at bedtime.  07/14/15  Yes [provider]    Physical Exam: Vitals:   06/19/17 2148 06/19/17 2154 06/19/17 2215 06/19/17 2230  BP: 140/80  128/66 (!) 146/82  Pulse: 63  69 68  Resp: 15  20 14   Temp:  97.9 F (36.6 C)    TempSrc:      SpO2: 97%  95% 98%    Constitutional: NAD, calm, comfortable Eyes: PERRL, lids and conjunctivae normal ENMT: Mucous membranes are moist. Posterior pharynx clear of any exudate or lesions.Normal dentition.  Neck: normal, supple, no masses, no thyromegaly Respiratory: clear to auscultation bilaterally, no wheezing, no crackles. Normal respiratory effort. No accessory muscle use.  Cardiovascular: Regular rate and rhythm, no murmurs / rubs / gallops. No extremity edema. 2+ pedal pulses. No carotid bruits.  Abdomen: no tenderness, no masses palpated. No hepatosplenomegaly. Bowel sounds positive.  Musculoskeletal: no  clubbing / cyanosis. No joint deformity upper and lower extremities. Good ROM, no contractures. Normal muscle tone.  Skin: no rashes, lesions, ulcers. No induration Neurologic: CN 2-12 grossly intact. Sensation intact, DTR normal. Strength 5/5 in all 4.  Psychiatric: Normal judgment and insight. Alert and oriented x 3. Normal mood.    Labs on Admission: I have personally reviewed following labs and imaging studies  CBC: Recent Labs  Lab 06/19/17 1508  WBC 6.6  HGB 14.3  HCT 40.9  MCV  86.8  PLT 152   Basic Metabolic Panel: Recent Labs  Lab 06/19/17 1508  NA 139  K 3.6  CL 104  CO2 25  GLUCOSE 95  BUN 19  CREATININE 1.41*  CALCIUM 9.3   GFR: CrCl cannot be calculated (Unknown ideal weight.). Liver Function Tests: Recent Labs  Lab 06/19/17 2056  AST 30  ALT 15*  ALKPHOS 45  BILITOT 1.8*  PROT 7.5  ALBUMIN 3.9   No results for input(s): LIPASE, AMYLASE in the last 168 hours. No results for input(s): AMMONIA in the last 168 hours. Coagulation Profile: No results for input(s): INR, PROTIME in the last 168 hours. Cardiac Enzymes: Recent Labs  Lab 06/19/17 2056  TROPONINI <0.03   BNP (last 3 results) No results for input(s): PROBNP in the last 8760 hours. HbA1C: No results for input(s): HGBA1C in the last 72 hours. CBG: Recent Labs  Lab 06/19/17 2031  GLUCAP 90   Lipid Profile: No results for input(s): CHOL, HDL, LDLCALC, TRIG, CHOLHDL, LDLDIRECT in the last 72 hours. Thyroid Function Tests: Recent Labs    06/19/17 2027  TSH 2.271   Anemia Panel: No results for input(s): VITAMINB12, FOLATE, FERRITIN, TIBC, IRON, RETICCTPCT in the last 72 hours. Urine analysis: No results found for: COLORURINE, APPEARANCEUR, LABSPEC, PHURINE, GLUCOSEU, HGBUR, BILIRUBINUR, KETONESUR, PROTEINUR, UROBILINOGEN, NITRITE, LEUKOCYTESUR  Radiological Exams on Admission: Dg Chest 2 View  Result Date: 06/19/2017 CLINICAL DATA:  Generalized weakness EXAM: CHEST - 2 VIEW COMPARISON:  None. FINDINGS: Cardiac shadow is within normal limits. Mild linear density is noted in the left base likely representing scarring or atelectasis. Mild degenerative changes of the thoracic spine are noted. IMPRESSION: Changes in the left base likely representing atelectasis and/or scarring. Electronically Signed   By: Alcide Clever M.D.   On: 06/19/2017 21:18   Ct Head Wo Contrast  Result Date: 06/19/2017 CLINICAL DATA:  Acute onset of generalized weakness and altered mental status.  EXAM: CT HEAD WITHOUT CONTRAST TECHNIQUE: Contiguous axial images were obtained from the base of the skull through the vertex without intravenous contrast. COMPARISON:  None. FINDINGS: Brain: No evidence of acute infarction, hemorrhage, hydrocephalus, extra-axial collection or mass lesion / mass effect. Prominence of the ventricles and sulci reflects moderate cortical volume loss. Mild cerebellar atrophy is noted. Scattered periventricular and subcortical white matter change likely reflects small vessel ischemic microangiopathy. The brainstem and fourth ventricle are within normal limits. The basal ganglia are unremarkable in appearance. The cerebral hemispheres demonstrate grossly normal gray-white differentiation. No mass effect or midline shift is seen. Vascular: No hyperdense vessel or unexpected calcification. Skull: There is no evidence of fracture; visualized osseous structures are unremarkable in appearance. Sinuses/Orbits: The orbits are within normal limits. The paranasal sinuses and mastoid air cells are well-aerated. Other: No significant soft tissue abnormalities are seen. IMPRESSION: 1. No acute intracranial pathology seen on CT. 2. Moderate cortical volume loss and scattered small vessel ischemic microangiopathy. Electronically Signed   By: Beryle Beams.D.  On: 06/19/2017 21:31    EKG: Independently reviewed.  Assessment/Plan Principal Problem:   TIA (transient ischemic attack) Active Problems:   HTN (hypertension)   DM2 (diabetes mellitus, type 2) (HCC)    1. TIA - 1. Stroke pathway and work up 2. MRI/MRA 3. 2d echo 4. Carotid dopplers 5. Will put on ASA 325 6. PT/OT/SLP 2. HTN - continue home meds 3. DM2 - 1. appears to be diet controlled 2. A1C in AM  DVT prophylaxis: Lovenox Code Status: Full Family Communication: No family in room Disposition Plan: Home after admit Consults called: None, call neuro in AM or if MRI positive Admission status: Place in  obs   Cache Decoursey, Heywood Iles. DO [facility] Pager [phone]  If 7AM-7PM, please contact day team taking care of patient [email or web address] Password [email or web address]  06/19/2017, 11:06 PM

## 2017-06-19 NOTE — ED Notes (Signed)
Patient transported to CT 

## 2017-06-19 NOTE — ED Provider Notes (Signed)
Emergency Department Provider Note   I have reviewed the triage vital signs and the nursing notes.   HISTORY  Chief Complaint Weakness   HPI Christian [name] is a 84 y.o. male presents to the emergency department for evaluation of acute onset altered mental status and weakness.  The patient describes more generalized weakness but the patient's wife says that they were sitting down to eat lunch at approximately 11 AM when he began showing weakness in his right arm and some in his leg.  She states that he spilled some coffee which she was trying to pick up and had trouble lifting the arm.  Paramedics were called to the scene and evaluated the patient.  They wanted to transport him at that time but he refused.  Wife states he seemed slightly more confused than normal but his weakness was not as pronounced.  Permax were called back approximately 1 hour later with some worsening confusion.  No fevers or chills.  No prior strokes.  Patient denies any pain in the chest or dyspnea.   Past Medical History:  Diagnosis Date  . BPH (benign prostatic hyperplasia)   . Cataract   . CKD (chronic kidney disease)   . DM2 (diabetes mellitus, type 2) (HCC)   . HTN (hypertension)   . Macular degeneration (senile) of retina   . RBBB with left anterior fascicular block     Patient Active Problem List   Diagnosis Date Noted  . TIA (transient ischemic attack) 06/19/2017  . HTN (hypertension) 06/19/2017  . DM2 (diabetes mellitus, type 2) (HCC) 06/19/2017    History reviewed. No pertinent surgical history.    Allergies Tetanus toxoid, adsorbed and Tetanus toxoids  History reviewed. No pertinent family history.  Social History Social History   Tobacco Use  . Smoking status: Former Games developermoker  . Smokeless tobacco: Never Used  Substance Use Topics  . Alcohol use: No    Alcohol/week: 0.0 oz  . Drug use: No    Review of Systems  Constitutional: No fever/chills. Positive generalized weakness and  AMS per wife.  Eyes: No visual changes. ENT: No sore throat. Cardiovascular: Denies chest pain. Respiratory: Denies shortness of breath. Gastrointestinal: No abdominal pain.  No nausea, no vomiting.  No diarrhea.  No constipation. Genitourinary: Negative for dysuria. Musculoskeletal: Negative for back pain. Skin: Negative for rash. Neurological: Negative for headaches or numbness. RUE weakness that has resolved.   10-point ROS otherwise negative.  ____________________________________________   PHYSICAL EXAM:  VITAL SIGNS: ED Triage Vitals  Enc Vitals Group     BP 06/19/17 1447 119/64     Pulse Rate 06/19/17 1447 72     Resp 06/19/17 1447 18     Temp 06/19/17 1447 98.1 F (36.7 C)     Temp Source 06/19/17 1447 Oral     SpO2 06/19/17 1447 98 %     Pain Score 06/19/17 1432 0   Constitutional: Alert and oriented. Well appearing and in no acute distress. Eyes: Conjunctivae are normal. PERRL. EOMI. Head: Atraumatic. Nose: No congestion/rhinnorhea. Mouth/Throat: Mucous membranes are moist.  Neck: No stridor.  Cardiovascular: Normal rate, regular rhythm. Good peripheral circulation. Grossly normal heart sounds.   Respiratory: Normal respiratory effort.  No retractions. Lungs CTAB. Gastrointestinal: Soft and nontender. No distention.  Musculoskeletal: No lower extremity tenderness nor edema. No gross deformities of extremities. Neurologic:  Normal speech and language. No gross focal neurologic deficits are appreciated. Normal CN exam 2-12. Normal finger-to-nose testing. No pronator drift.  Skin:  Skin is warm, dry and intact. No rash noted.  ____________________________________________   LABS (all labs ordered are listed, but only abnormal results are displayed)  Labs Reviewed  BASIC METABOLIC PANEL - Abnormal; Notable for the following components:      Result Value   Creatinine, Ser 1.41 (*)    GFR calc non Af Amer 42 (*)    GFR calc Af Amer 49 (*)    All other  components within normal limits  HEPATIC FUNCTION PANEL - Abnormal; Notable for the following components:   ALT 15 (*)    Total Bilirubin 1.8 (*)    Indirect Bilirubin 1.4 (*)    All other components within normal limits  CBC  URINALYSIS, ROUTINE W REFLEX MICROSCOPIC  TROPONIN I  TSH  LIPID PANEL  HEMOGLOBIN A1C  CBG MONITORING, ED   ____________________________________________  EKG   EKG Interpretation  Date/Time:  Friday June 19 2017 14:49:00 EDT Ventricular Rate:  72 PR Interval:  150 QRS Duration: 84 QT Interval:  358 QTC Calculation: 392 R Axis:   -50 Text Interpretation:  Normal sinus rhythm Left axis deviation Nonspecific T wave abnormality Abnormal ECG No STEMI.  Confirmed by [name] [phone]) on 06/19/2017 8:26:19 PM       ____________________________________________  RADIOLOGY  Dg Chest 2 View  Result Date: 06/19/2017 CLINICAL DATA:  Generalized weakness EXAM: CHEST - 2 VIEW COMPARISON:  None. FINDINGS: Cardiac shadow is within normal limits. Mild linear density is noted in the left base likely representing scarring or atelectasis. Mild degenerative changes of the thoracic spine are noted. IMPRESSION: Changes in the left base likely representing atelectasis and/or scarring. Electronically Signed   By: Alcide Clever M.D.   On: 06/19/2017 21:18   Ct Head Wo Contrast  Result Date: 06/19/2017 CLINICAL DATA:  Acute onset of generalized weakness and altered mental status. EXAM: CT HEAD WITHOUT CONTRAST TECHNIQUE: Contiguous axial images were obtained from the base of the skull through the vertex without intravenous contrast. COMPARISON:  None. FINDINGS: Brain: No evidence of acute infarction, hemorrhage, hydrocephalus, extra-axial collection or mass lesion / mass effect. Prominence of the ventricles and sulci reflects moderate cortical volume loss. Mild cerebellar atrophy is noted. Scattered periventricular and subcortical white matter change likely reflects small  vessel ischemic microangiopathy. The brainstem and fourth ventricle are within normal limits. The basal ganglia are unremarkable in appearance. The cerebral hemispheres demonstrate grossly normal gray-white differentiation. No mass effect or midline shift is seen. Vascular: No hyperdense vessel or unexpected calcification. Skull: There is no evidence of fracture; visualized osseous structures are unremarkable in appearance. Sinuses/Orbits: The orbits are within normal limits. The paranasal sinuses and mastoid air cells are well-aerated. Other: No significant soft tissue abnormalities are seen. IMPRESSION: 1. No acute intracranial pathology seen on CT. 2. Moderate cortical volume loss and scattered small vessel ischemic microangiopathy. Electronically Signed   By: Roanna Raider M.D.   On: 06/19/2017 21:31   Mr Brain Wo Contrast  Result Date: 06/20/2017 CLINICAL DATA:  Initial evaluation for acute right arm and leg weakness, confusion. EXAM: MRI HEAD WITHOUT CONTRAST MRA HEAD WITHOUT CONTRAST TECHNIQUE: Multiplanar, multiecho pulse sequences of the brain and surrounding structures were obtained without intravenous contrast. Angiographic images of the head were obtained using MRA technique without contrast. COMPARISON:  Prior CT from 06/19/2017. FINDINGS: MRI HEAD FINDINGS Brain: Diffuse prominence of the CSF containing spaces compatible with generalized cerebral atrophy. Minimal patchy T2/FLAIR hyperintensity present within the periventricular white matter, likely related chronic small  vessel ischemic changes, mild for age. Small remote lacunar infarcts seen involving the left basal ganglia. Few punctate acute ischemic infarcts seen involving the left basal ganglia and periventricular white matter of the posterior left corona radiata (series 5001, image 63). These measure up to 3 mm. Additional tiny punctate cortical infarct noted posteriorly within the left parietal lobe (series 5001, image 67). No associated  hemorrhage or mass effect. No other evidence for acute or subacute ischemia. No evidence for acute or chronic intracranial hemorrhage. No other areas of remote cortical infarction. No mass lesion, midline shift or mass effect. Mild ventricular prominence related to global parenchymal volume loss of hydrocephalus. No extra-axial fluid collection. Major dural sinuses are grossly patent. Pituitary gland suprasellar region normal. Midline structures intact. Vascular: Abnormal flow void within the right ICA to the supraclinoid segment, likely occluded. Major intracranial vascular flow voids otherwise maintained. Skull and upper cervical spine: Craniocervical junction normal. Moderate degenerative spondylolysis within the upper cervical spine with resultant moderate spinal stenosis at C4-5. Bone marrow signal intensity within normal limits. No scalp soft tissue abnormality. Sinuses/Orbits: Globes orbital soft tissues normal. Patient status post lens extraction bilaterally. Scattered mucosal thickening within the ethmoidal sinuses. Paranasal sinuses are otherwise clear. No mastoid effusion. Inner ear structures normal. Other: None. MRA HEAD FINDINGS ANTERIOR CIRCULATION: Right ICA is occluded. Distal reconstitution at the supraclinoid segment via flow cross the circle-of-Willis. Right ophthalmic artery does appear to be grossly patent proximally. Distal cervical left ICA widely patent with antegrade flow. Mild atheromatous irregularity at the cervical petrous junction without high-grade stenosis. Petrous, cavernous, and supraclinoid left ICA patent without stenosis. Left ophthalmic artery patent. Left ICA terminus widely patent. A1 segments and anterior communicating artery widely patent with good flow cross the circle-of-Willis. Anterior cerebral arteries well perfused bilaterally. Left M1 segment widely patent. No proximal left M2 occlusion. Right M1 segment perfused without high-grade stenosis. Short-segment moderate  right M2 stenosis without occlusion noted. Distal MCA branches fairly symmetric and well perfused bilaterally. Small vessel atheromatous irregularity noted. POSTERIOR CIRCULATION: Vertebral arteries code dominant and patent to the vertebrobasilar junction. Posterior inferior cerebral arteries not assessed on this exam. Basilar artery patent to its distal aspect without stenosis. Superior cerebral arteries patent bilaterally. Motion artifact limits evaluation of the basilar tip and proximal PCAs. Parent signal void at the right P1/P2 junction on reconstructed images favored to be motion related. PCAs well perfused to their distal aspects without stenosis. Bilateral posterior communicating arteries noted, right greater than left. Specifically, the right P com is patent, and likely contribute some flow to the right MCA territory as well. No aneurysm. IMPRESSION: MRI HEAD IMPRESSION 1. Three total punctate subcentimeter acute ischemic nonhemorrhagic infarcts involving the left basal ganglia, left periventricular white matter, and left parietal lobe as above. 2. Age-related cerebral atrophy with mild chronic small vessel ischemic disease. 3. Abnormal flow void within the right ICA, consistent with occlusion (see below). 4. Degenerative spondylolysis at C4-5 with resultant moderate spinal stenosis. MRA HEAD IMPRESSION 1. Occluded right ICA with distal reconstitution at the supraclinoid segment via flow across the circle-of-Willis and/or from the posterior circulation. Right anterior and middle cerebral arteries are well perfused. 2. No other hemodynamically significant or correctable stenosis within the intracranial circulation. 3. Distal small vessel atheromatous irregularity. Electronically Signed   By: Rise Mu M.D.   On: 06/20/2017 02:57   Mr Shirlee Latch ZO Contrast  Result Date: 06/20/2017 CLINICAL DATA:  Initial evaluation for acute right arm and leg weakness, confusion. EXAM:  MRI HEAD WITHOUT CONTRAST  MRA HEAD WITHOUT CONTRAST TECHNIQUE: Multiplanar, multiecho pulse sequences of the brain and surrounding structures were obtained without intravenous contrast. Angiographic images of the head were obtained using MRA technique without contrast. COMPARISON:  Prior CT from 06/19/2017. FINDINGS: MRI HEAD FINDINGS Brain: Diffuse prominence of the CSF containing spaces compatible with generalized cerebral atrophy. Minimal patchy T2/FLAIR hyperintensity present within the periventricular white matter, likely related chronic small vessel ischemic changes, mild for age. Small remote lacunar infarcts seen involving the left basal ganglia. Few punctate acute ischemic infarcts seen involving the left basal ganglia and periventricular white matter of the posterior left corona radiata (series 5001, image 63). These measure up to 3 mm. Additional tiny punctate cortical infarct noted posteriorly within the left parietal lobe (series 5001, image 67). No associated hemorrhage or mass effect. No other evidence for acute or subacute ischemia. No evidence for acute or chronic intracranial hemorrhage. No other areas of remote cortical infarction. No mass lesion, midline shift or mass effect. Mild ventricular prominence related to global parenchymal volume loss of hydrocephalus. No extra-axial fluid collection. Major dural sinuses are grossly patent. Pituitary gland suprasellar region normal. Midline structures intact. Vascular: Abnormal flow void within the right ICA to the supraclinoid segment, likely occluded. Major intracranial vascular flow voids otherwise maintained. Skull and upper cervical spine: Craniocervical junction normal. Moderate degenerative spondylolysis within the upper cervical spine with resultant moderate spinal stenosis at C4-5. Bone marrow signal intensity within normal limits. No scalp soft tissue abnormality. Sinuses/Orbits: Globes orbital soft tissues normal. Patient status post lens extraction bilaterally.  Scattered mucosal thickening within the ethmoidal sinuses. Paranasal sinuses are otherwise clear. No mastoid effusion. Inner ear structures normal. Other: None. MRA HEAD FINDINGS ANTERIOR CIRCULATION: Right ICA is occluded. Distal reconstitution at the supraclinoid segment via flow cross the circle-of-Willis. Right ophthalmic artery does appear to be grossly patent proximally. Distal cervical left ICA widely patent with antegrade flow. Mild atheromatous irregularity at the cervical petrous junction without high-grade stenosis. Petrous, cavernous, and supraclinoid left ICA patent without stenosis. Left ophthalmic artery patent. Left ICA terminus widely patent. A1 segments and anterior communicating artery widely patent with good flow cross the circle-of-Willis. Anterior cerebral arteries well perfused bilaterally. Left M1 segment widely patent. No proximal left M2 occlusion. Right M1 segment perfused without high-grade stenosis. Short-segment moderate right M2 stenosis without occlusion noted. Distal MCA branches fairly symmetric and well perfused bilaterally. Small vessel atheromatous irregularity noted. POSTERIOR CIRCULATION: Vertebral arteries code dominant and patent to the vertebrobasilar junction. Posterior inferior cerebral arteries not assessed on this exam. Basilar artery patent to its distal aspect without stenosis. Superior cerebral arteries patent bilaterally. Motion artifact limits evaluation of the basilar tip and proximal PCAs. Parent signal void at the right P1/P2 junction on reconstructed images favored to be motion related. PCAs well perfused to their distal aspects without stenosis. Bilateral posterior communicating arteries noted, right greater than left. Specifically, the right P com is patent, and likely contribute some flow to the right MCA territory as well. No aneurysm. IMPRESSION: MRI HEAD IMPRESSION 1. Three total punctate subcentimeter acute ischemic nonhemorrhagic infarcts involving the  left basal ganglia, left periventricular white matter, and left parietal lobe as above. 2. Age-related cerebral atrophy with mild chronic small vessel ischemic disease. 3. Abnormal flow void within the right ICA, consistent with occlusion (see below). 4. Degenerative spondylolysis at C4-5 with resultant moderate spinal stenosis. MRA HEAD IMPRESSION 1. Occluded right ICA with distal reconstitution at the supraclinoid segment via flow  across the circle-of-Willis and/or from the posterior circulation. Right anterior and middle cerebral arteries are well perfused. 2. No other hemodynamically significant or correctable stenosis within the intracranial circulation. 3. Distal small vessel atheromatous irregularity. Electronically Signed   By: Rise Mu M.D.   On: 06/20/2017 02:57    ____________________________________________   PROCEDURES  Procedure(s) performed:   Procedures  None ____________________________________________   INITIAL IMPRESSION / ASSESSMENT AND PLAN / ED COURSE  Pertinent labs & imaging results that were available during my care of the patient were reviewed by me and considered in my medical decision making (see chart for details).  Patient presents to the emergency permit for evaluation of altered mental status and generalized weakness.  To the wife he seemed to have really pronounced right upper extremity weakness with dropping coffee and difficult the lifting the arm around the time of symptom onset.  Patient is reported to be slightly confused according to the wife but is alert and oriented x4 for me.  No focal neurological deficits on my exam.  I do some concern for TIA in the situation given the wife's report of the patient's symptoms.  He is LVO negative.   CT head and labs reviewed without acute findings. Plan for admission for TIA evaluation. MR brain ordered.   Discussed patient's case with Hospitlalist, Dr. Julian Reil to request admission. Patient and family  (if present) updated with plan. Care transferred to Hospitalist service.  I reviewed all nursing notes, vitals, pertinent old records, EKGs, labs, imaging (as available).  ____________________________________________  FINAL CLINICAL IMPRESSION(S) / ED DIAGNOSES  Final diagnoses:  TIA (transient ischemic attack)     MEDICATIONS GIVEN DURING THIS VISIT:  Medications  levothyroxine (SYNTHROID, LEVOTHROID) tablet 25 mcg (25 mcg Oral Given 06/20/17 0825)  cholecalciferol (VITAMIN D) tablet 1,000 Units (1,000 Units Oral Not Given 06/20/17 0032)  montelukast (SINGULAIR) tablet 10 mg (10 mg Oral Given 06/20/17 0143)  tamsulosin (FLOMAX) capsule 0.4 mg (0.4 mg Oral Given 06/20/17 0143)  acetaminophen (TYLENOL) tablet 650 mg (has no administration in time range)    Or  acetaminophen (TYLENOL) solution 650 mg (has no administration in time range)    Or  acetaminophen (TYLENOL) suppository 650 mg (has no administration in time range)  senna-docusate (Senokot-S) tablet 1 tablet (has no administration in time range)  enoxaparin (LOVENOX) injection 40 mg (40 mg Subcutaneous Given 06/20/17 0825)  aspirin suppository 300 mg ( Rectal See Alternative 06/20/17 0825)    Or  aspirin tablet 325 mg (325 mg Oral Given 06/20/17 0825)  clopidogrel (PLAVIX) tablet 75 mg (has no administration in time range)   stroke: mapping our early stages of recovery book ( Does not apply Given 06/20/17 0029)  clopidogrel (PLAVIX) tablet 300 mg (300 mg Oral Given 06/20/17 0319)  iopamidol (ISOVUE-370) 76 % injection (50 mLs  Contrast Given 06/20/17 0837)    Note:  This document was prepared using Dragon voice recognition software and may include unintentional dictation errors.  [name], MD Emergency Medicine    Donnavin Vandenbrink, Arlyss Repress, MD 06/20/17 [phone]

## 2017-06-20 ENCOUNTER — Observation Stay (HOSPITAL_COMMUNITY): Payer: Medicare Other

## 2017-06-20 ENCOUNTER — Encounter (HOSPITAL_COMMUNITY): Payer: Self-pay

## 2017-06-20 ENCOUNTER — Other Ambulatory Visit: Payer: Self-pay

## 2017-06-20 DIAGNOSIS — E119 Type 2 diabetes mellitus without complications: Secondary | ICD-10-CM | POA: Diagnosis not present

## 2017-06-20 DIAGNOSIS — I6522 Occlusion and stenosis of left carotid artery: Secondary | ICD-10-CM

## 2017-06-20 DIAGNOSIS — I10 Essential (primary) hypertension: Secondary | ICD-10-CM | POA: Diagnosis not present

## 2017-06-20 DIAGNOSIS — G459 Transient cerebral ischemic attack, unspecified: Secondary | ICD-10-CM | POA: Diagnosis not present

## 2017-06-20 DIAGNOSIS — E039 Hypothyroidism, unspecified: Secondary | ICD-10-CM

## 2017-06-20 DIAGNOSIS — N183 Chronic kidney disease, stage 3 unspecified: Secondary | ICD-10-CM | POA: Diagnosis present

## 2017-06-20 DIAGNOSIS — I6521 Occlusion and stenosis of right carotid artery: Secondary | ICD-10-CM | POA: Diagnosis present

## 2017-06-20 DIAGNOSIS — H3530 Unspecified macular degeneration: Secondary | ICD-10-CM | POA: Diagnosis present

## 2017-06-20 DIAGNOSIS — E1159 Type 2 diabetes mellitus with other circulatory complications: Secondary | ICD-10-CM

## 2017-06-20 DIAGNOSIS — I639 Cerebral infarction, unspecified: Secondary | ICD-10-CM | POA: Diagnosis not present

## 2017-06-20 DIAGNOSIS — N400 Enlarged prostate without lower urinary tract symptoms: Secondary | ICD-10-CM | POA: Diagnosis present

## 2017-06-20 LAB — LIPID PANEL
Cholesterol: 127 mg/dL (ref 0–200)
HDL: 52 mg/dL (ref >40)
LDL CALC: 57 mg/dL (ref 0–99)
TRIGLYCERIDES: 90 mg/dL (ref <150)
Total CHOL/HDL Ratio: 2.4 RATIO
VLDL: 18 mg/dL (ref 0–40)

## 2017-06-20 LAB — HEMOGLOBIN A1C
HEMOGLOBIN A1C: 6 % — AB (ref 4.8–5.6)
Mean Plasma Glucose: 125.5 mg/dL

## 2017-06-20 MED ORDER — ASPIRIN EC 81 MG PO TBEC
81.0000 mg | DELAYED_RELEASE_TABLET | Freq: Every day | ORAL | Status: DC
  Administered 2017-06-21 – 2017-06-22 (×2): 81 mg via ORAL
  Filled 2017-06-20 (×2): qty 1

## 2017-06-20 MED ORDER — CLOPIDOGREL BISULFATE 75 MG PO TABS
75.0000 mg | ORAL_TABLET | Freq: Every day | ORAL | Status: DC
  Administered 2017-06-21 – 2017-06-22 (×2): 75 mg via ORAL
  Filled 2017-06-20 (×2): qty 1

## 2017-06-20 MED ORDER — CLOPIDOGREL BISULFATE 75 MG PO TABS
300.0000 mg | ORAL_TABLET | Freq: Once | ORAL | Status: AC
  Administered 2017-06-20: 300 mg via ORAL
  Filled 2017-06-20: qty 4

## 2017-06-20 MED ORDER — IOPAMIDOL (ISOVUE-370) INJECTION 76%
INTRAVENOUS | Status: AC
  Administered 2017-06-20: 50 mL
  Filled 2017-06-20: qty 50

## 2017-06-20 NOTE — Consult Note (Signed)
Neurology Consultation Reason for Consult: Stroke Referring Physician: Laban Emperor  CC: Right-sided weakness  History is obtained from: Patient  HPI: Christian [name] is a 84 y.o. male with a history of diabetes, hypertension, CKD who presents with transient episode of right-sided weakness.  He states that it lasted approximately 10 minutes.  There was some question of confusion as well, but weakness improved.  He was admitted for possible TIA and an MRI was performed which demonstrates a small periventricular stroke on the left.   LKW: 11 AM tpa given?: no, symptoms resolved, outside of window   ROS: A 14 point ROS was performed and is negative except as noted in the HPI.   Past Medical History:  Diagnosis Date  . BPH (benign prostatic hyperplasia)   . Cataract   . CKD (chronic kidney disease)   . DM2 (diabetes mellitus, type 2) (HCC)   . HTN (hypertension)   . Macular degeneration (senile) of retina   . RBBB with left anterior fascicular block      History reviewed. No pertinent family history.   Social History:  reports that he has quit smoking. He has never used smokeless tobacco. He reports that he does not drink alcohol or use drugs.   Exam: Current vital signs: BP (!) 149/80 (BP Location: Left Arm)   Pulse 72   Temp 99 F (37.2 C) (Oral)   Resp 18   Ht 6' (1.829 m)   Wt 80.9 kg (178 lb 5.6 oz)   SpO2 98%   BMI 24.19 kg/m  Vital signs in last 24 hours: Temp:  [97.9 F (36.6 C)-99 F (37.2 C)] 99 F (37.2 C) (04/19 2358) Pulse Rate:  [60-77] 72 (04/19 2358) Resp:  [14-20] 18 (04/19 2358) BP: (117-149)/(64-93) 149/80 (04/19 2358) SpO2:  [95 %-100 %] 98 % (04/19 2358) Weight:  [78.1 kg (172 lb 2.9 oz)-80.9 kg (178 lb 5.6 oz)] 80.9 kg (178 lb 5.6 oz) (04/19 2358)   Physical Exam  Constitutional: Appears well-developed and well-nourished.  Psych: Affect appropriate to situation Eyes: No scleral injection HENT: No OP obstrucion Head: Normocephalic.   Cardiovascular: Normal rate and regular rhythm.  Respiratory: Effort normal, non-labored breathing GI: Soft.  No distension. There is no tenderness.  Skin: WDI  Neuro: Mental Status: Patient is awake, alert, oriented to person, place, month, year, and situation. Patient is able to give a clear and coherent history. No signs of aphasia or neglect Cranial Nerves: II: Visual Fields are full. Pupils are equal, round, and reactive to light.   III,IV, VI: EOMI without ptosis or diploplia.  V: Facial sensation is symmetric to temperature VII: Facial movement is symmetric.  VIII: He is hard of hearing, but with hearing aids is able to understand X: Uvula elevates symmetrically XI: Shoulder shrug is symmetric. XII: tongue is midline without atrophy or fasciculations.  Motor: Tone is normal. Bulk is normal. 5/5 strength was present in all four extremities.  Sensory: Sensation is symmetric to light touch and temperature in the arms and legs. Cerebellar: FNF  intact bilaterally  I have reviewed labs in epic and the results pertinent to this consultation are: Borderline creatinine  I have reviewed the images obtained: MRI brain-small stroke on the left, occluded right carotid  Impression: 84 year old male with small ischemic infarct.  He is being admitted for further evaluation for secondary prevention.  He takes aspirin at baseline  Recommendations: 1. HgbA1c, fasting lipid panel 2. Frequent neuro checks 3. Echocardiogram 4. Carotid dopplers 5.  Prophylactic therapy-dual antiplatelet therapy x 3 weeks 6. Risk factor modification 7. Telemetry monitoring 8. PT consult, OT consult, Speech consult 9. please page stroke NP  Or  PA  Or MD  from 8am -4 pm as this patient will be followed by the stroke team at this point.   You can look them up on [email or web address]      Ritta SlotMcNeill Symon Norwood, MD [facility] [phone]  If 7pm- 7am, please page neurology on call as listed in  AMION.

## 2017-06-20 NOTE — Progress Notes (Signed)
STROKE TEAM PROGRESS NOTE   SUBJECTIVE (INTERVAL HISTORY) His wife is at the bedside.  They stated that patient while eating supper, had a sudden onset right arm weakness, not able to pick up the coffee cup.  Lasted about 30 minutes.  Also felt bilateral lower extremity weakness.  Currently all resolved.  MRI showed 2-3 punctate left subcortical and periventricular infarcts.  Patient denies any history of stroke in the past.   OBJECTIVE Temp:  [97.9 F (36.6 C)-99 F (37.2 C)] 98.7 F (37.1 C) (04/20 0455) Pulse Rate:  [60-77] 70 (04/20 0455) Cardiac Rhythm: Normal sinus rhythm (04/20 0705) Resp:  [14-20] 18 (04/20 0455) BP: (117-149)/(64-93) 119/77 (04/20 0455) SpO2:  [95 %-100 %] 98 % (04/20 0455) Weight:  [172 lb 2.9 oz (78.1 kg)-178 lb 5.6 oz (80.9 kg)] 178 lb 5.6 oz (80.9 kg) (04/19 2358)  CBC:  Recent Labs  Lab 06/19/17 1508  WBC 6.6  HGB 14.3  HCT 40.9  MCV 86.8  PLT 152    Basic Metabolic Panel:  Recent Labs  Lab 06/19/17 1508  NA 139  K 3.6  CL 104  CO2 25  GLUCOSE 95  BUN 19  CREATININE 1.41*  CALCIUM 9.3    Lipid Panel:     Component Value Date/Time   CHOL 127 06/20/2017 0359   TRIG 90 06/20/2017 0359   HDL 52 06/20/2017 0359   CHOLHDL 2.4 06/20/2017 0359   VLDL 18 06/20/2017 0359   LDLCALC 57 06/20/2017 0359   HgbA1c:  Lab Results  Component Value Date   HGBA1C 6.0 (H) 06/20/2017   Urine Drug Screen: No results found for: LABOPIA, COCAINSCRNUR, LABBENZ, AMPHETMU, THCU, LABBARB  Alcohol Level No results found for: [facility]  IMAGING I have personally reviewed the radiological images below and agree with the radiology interpretations. On history of a Ct Head Wo Contrast 06/19/2017 IMPRESSION:  1. No acute intracranial pathology seen on CT.  2. Moderate cortical volume loss and scattered small vessel ischemic microangiopathy.   CTA H&N 06/20/2017 IMPRESSION: 1. Right common carotid artery occlusion with distal reconstitution of the internal  and external carotid arteries. 2. 60% distal left common carotid artery stenosis due to bulky calcified plaque. 3. Widely patent vertebral arteries. 4. Patent circle-of-Willis without proximal branch occlusion or flow limiting proximal stenosis. 5. Aortic Atherosclerosis (ICD10-I70.0) and Emphysema (ICD10-J43.9).  Mr [name] Head Wo Contrast 06/20/2017 IMPRESSION:   MRI HEAD  1. Three total punctate subcentimeter acute ischemic nonhemorrhagic infarcts involving the left basal ganglia, left periventricular white matter, and left parietal lobe as above.  2. Age-related cerebral atrophy with mild chronic small vessel ischemic disease.  3. Abnormal flow void within the right ICA, consistent with occlusion (see below).  4. Degenerative spondylolysis at C4-5 with resultant moderate spinal stenosis.    MRA HEAD  1. Occluded right ICA with distal reconstitution at the supraclinoid segment via flow across the circle-of-Willis and/or from the posterior circulation. Right anterior and middle cerebral arteries are well perfused.  2. No other hemodynamically significant or correctable stenosis within the intracranial circulation.  3. Distal small vessel atheromatous irregularity.    Transthoracic Echocardiogram - pending   PHYSICAL EXAM  Temp:  [97.7 F (36.5 C)-99 F (37.2 C)] 97.7 F (36.5 C) (04/20 2012) Pulse Rate:  [70-79] 74 (04/20 2012) Resp:  [14-20] 18 (04/20 2012) BP: (119-149)/(68-93) 143/75 (04/20 2012) SpO2:  [95 %-100 %] 99 % (04/20 2012) Weight:  [172 lb 2.9 oz (78.1 kg)-178 lb 5.6 oz (80.9 kg)] 178  lb 5.6 oz (80.9 kg) (04/19 2358)  General - Well nourished, well developed, in no apparent distress.  Ophthalmologic - Fundi not visualized due to eye movement.  Cardiovascular - Regular rate and rhythm.  Mental Status -  Level of arousal and orientation to time, place, and person were intact. Language including expression, naming, repetition, comprehension was assessed and  found intact. Fund of Knowledge was assessed and was intact.  Cranial Nerves II - XII - II - Visual field intact OU. III, IV, VI - Extraocular movements intact. V - Facial sensation intact bilaterally. VII - Facial movement intact bilaterally. VIII - Hearing & vestibular intact bilaterally. X - Palate elevates symmetrically. XI - Chin turning & shoulder shrug intact bilaterally. XII - Tongue protrusion intact.  Motor Strength - The patient's strength was normal in all extremities and pronator drift was absent.  Bulk was normal and fasciculations were absent.   Motor Tone - Muscle tone was assessed at the neck and appendages and was normal.  Reflexes - The patient's reflexes were 1+ in all extremities and he had no pathological reflexes.  Sensory - Light touch, temperature/pinprick, vibration and proprioception, and Romberg testing were assessed and were symmetrical.    Coordination - The patient had normal movements in the hands and feet with no ataxia or dysmetria.  Tremor was absent.  Gait and Station - The patient's transfers, posture, gait, station, and turns were observed as normal.   ASSESSMENT/PLAN Christian [name] is a 84 y.o. male with history of right bundle branch block, hypertension, diabetes,  chronic kidney disease, and BPH presenting with transient right-sided weakness and confusion.Marland Kitchen. He did not receive IV t-PA due to late presentation.  Stroke: multiple left subcortical and periventricular punctate infarcts likely A-A emboli from left ICA stenosis with extensive plaque  Resultant deficit resolved  CT head - No acute intracranial pathology seen on CT.   MRI head - Three total punctate subcentimeter acute ischemic nonhemorrhagic infarcts involving the left basal ganglia, left periventricular white matter, and left parietal lobe as above.   MRA head - Occluded right ICA  CTA H&N -right ICA chronic occlusion, left ICA proximal 60% with extensive  atherosclerosis  2D Echo - pending  LDL - 57  HgbA1c - 6.0  VTE prophylaxis - Lovenox Fall precautions Diet Carb Modified Fluid consistency: Thin; Room service appropriate? Yes  aspirin 81 mg daily prior to admission, now on aspirin 325 mg daily and clopidogrel 75 mg daily  Patient counseled to be compliant with his antithrombotic medications  Ongoing aggressive stroke risk factor management  Therapy recommendations:  pending  Disposition:  Pending  Carotid artery occlusion/stenosis  Right ICA chronic occlusion  Left ICA 60% stenosis with extensive atherosclerosis  Current stroke likely due to left ICA symptomatic stenosis  Recommend vascular surgery consultation to consider CEA vs CAS  Hypertension  Stable  Permissive hypertension (OK if < 220/120) but gradually normalize in 5-7 days . Long-term BP goal 1 30-1 50 due to extracranial carotid occlusion/stenosis  Hyperlipidemia  Lipid lowering medication PTA:  none  LDL 57, goal < 70  Current lipid lowering medication:  none  Continue statin at discharge  Diabetes  HgbA1c 6.0, goal < 7.0  Controlled  SSI  CBG monitoring  Other Stroke Risk Factors  Advanced age  Former cigarette smoker - quit  Other Active Problems  BPH  Creatinine 1.41   Hospital day # 0  Marvel PlanJindong Shantrice Rodenberg, MD PhD Stroke Neurology 06/20/2017 10:37 PM  To contact Stroke Continuity provider, please refer to [email or web address] After hours, contact General Neurology

## 2017-06-20 NOTE — Evaluation (Signed)
Speech Language Pathology Evaluation Patient Details Name: Christian [name] MRN: [id number] DOB: 02/05/1924 Today's Date: 06/20/2017 Time: [id number] SLP Time Calculation (min) (ACUTE ONLY): 15 min  Problem List:  Patient Active Problem List   Diagnosis Date Noted  . CKD (chronic kidney disease), stage III (HCC) 06/20/2017  . Macular degeneration (senile) of retina 06/20/2017  . BPH (benign prostatic hyperplasia) 06/20/2017  . Right internal carotid occlusion 06/20/2017  . Acute CVA (cerebrovascular accident) (HCC) 06/19/2017  . HTN (hypertension) 06/19/2017  . DM2 (diabetes mellitus, type 2) (HCC) 06/19/2017   Past Medical History:  Past Medical History:  Diagnosis Date  . BPH (benign prostatic hyperplasia)   . Cataract   . CKD (chronic kidney disease)   . DM2 (diabetes mellitus, type 2) (HCC)   . HTN (hypertension)   . Macular degeneration (senile) of retina   . RBBB with left anterior fascicular block    Past Surgical History: History reviewed. No pertinent surgical history. HPI:  Patient is a 84 y.o. male admitted with AMS and Rt sided weakness. 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 Lt parietal lobe, and Lt periventricular white matter. PMH includes: macular degeneration, cataract, CKD, DM    Assessment / Plan / Recommendation Clinical Impression  Patient presents with cognitive-linguistic and speech functioning WNL. Patient is very animated and verbose, which spouse says is his normal. Patient did not present with any deficts of memory,problem solving, reasoning or safety awareness; language and speech both WNL.     SLP Assessment  SLP Recommendation/Assessment: Patient does not need any further Speech Lanaguage Pathology Services SLP Visit Diagnosis: Cognitive communication deficit (R41.841)    Follow Up Recommendations  None    Frequency and Duration   N/A        SLP Evaluation Cognition  Overall Cognitive Status: Within Functional Limits for tasks assessed Orientation Level: Oriented to person;Oriented to time;Oriented to place;Oriented to situation       Comprehension  Auditory Comprehension Overall Auditory Comprehension: Appears within functional limits for tasks assessed    Expression Expression Primary Mode of Expression: Verbal Verbal Expression Overall Verbal Expression: Appears within functional limits for tasks assessed Written Expression Dominant Hand: Right   Oral / Motor  Oral Motor/Sensory Function Overall Oral Motor/Sensory Function: Within functional limits   GO                    Angela NevinJohn T. Makalah Asberry, MA, CCC-SLP 06/20/17 2:51 PM

## 2017-06-20 NOTE — Progress Notes (Signed)
MRI official read pending, but looks like a small area of diffusion restriction, likely small acute ischemic stroke, periventricularly on the left by my read.  Also atrophy and his R carotid appears occluded by my read.  1) Spoke with Dr. Amada JupiterKirkpatrick who is going to see patient. 2) holding HCTZ and allowing permissive HTN 3) Convert to IP status in AM most likely

## 2017-06-20 NOTE — Progress Notes (Signed)
PROGRESS NOTE  [name]  ZOX:[id number] DOB: 1923-11-22 DOA: 06/19/2017 PCP: Clide Dales, PA   Brief Narrative: Christian [name] is a 83 y.o. male with a history of HTN, well-controlled T2DM, and stage III CKD who presented to the ED for right arm and leg weakness and increasing confusion found to have acute subcentimeter ischemic infarcts in the left basal ganglia, parietal lobe, and periventricular white matter on MRI. On arrival he was oriented with resolved neurological deficits. He was admitted for further work up.   Assessment & Plan: Principal Problem:   TIA (transient ischemic attack) Active Problems:   HTN (hypertension)   DM2 (diabetes mellitus, type 2) (HCC)  Acute ischemic CVAs:  - Echocardiogram pending - Continue telemetry - Intracranial evaluation with CTA head/neck per neurology - Started DAPT with ASA + plavix x3 weeks per neurology. Stroke team evaluation pending today. - PT/OT/SLP, though deficits have resolved.   T2DM: Well-controlled with HbA1c of 6% on diet.  - Monitor CBGs prn  HTN:  - Permissive HTN allowed, though remains normotensive despite holding HCTZ  Asthma: No exacerbation, does not even have prn albuterol at home - Singulair  Hypothyroidism: TSH 2.271 - Continue low dose synthroid  BPH: No retention noted - Continue flomax   DVT prophylaxis: Lovenox Code Status: Full Family Communication: None at bedside Disposition Plan: Home when work up complete  Consultants:   Neurology  Procedures:   Echocardiogram ordered  Antimicrobials:  None   Subjective: Symptoms resolved. No new complaints. Eating breakfast.   Objective: Vitals:   06/19/17 2339 06/19/17 2358 06/20/17 0455 06/20/17 1400  BP: (!) 141/93 (!) 149/80 119/77 136/68  Pulse: 75 72 70 79  Resp: 14 18 18 20   Temp:  99 F (37.2 C) 98.7 F (37.1 C) 97.7 F (36.5 C)  TempSrc:  Oral Oral Oral  SpO2: 97% 98% 98% 100%  Weight:  80.9 kg (178 lb 5.6 oz)      Height:  6' (1.829 m)      Intake/Output Summary (Last 24 hours) at 06/20/2017 1426 Last data filed at 06/20/2017 1300 Gross per 24 hour  Intake 600 ml  Output 700 ml  Net -100 ml   Filed Weights   06/19/17 2300 06/19/17 2358  Weight: 78.1 kg (172 lb 2.9 oz) 80.9 kg (178 lb 5.6 oz)    Gen: Pleasant, lively elderly male in no distress  Pulm: Non-labored breathing room air. Clear to auscultation bilaterally.  CV: Regular rate and rhythm. No murmur, rub, or gallop. No JVD, no pedal edema. GI: Abdomen soft, non-tender, non-distended, with normoactive bowel sounds. No organomegaly or masses felt. Ext: Warm, no deformities Skin: No rashes, lesions no ulcers Neuro: Alert and oriented. CN's intact, visual fields full to confrontation, gait normal, neg romberg. No focal neurological deficits. Psych: Judgement and insight appear normal. Mood & affect appropriate.   Data Reviewed: I have personally reviewed following labs and imaging studies  CBC: Recent Labs  Lab 06/19/17 1508  WBC 6.6  HGB 14.3  HCT 40.9  MCV 86.8  PLT 152   Basic Metabolic Panel: Recent Labs  Lab 06/19/17 1508  NA 139  K 3.6  CL 104  CO2 25  GLUCOSE 95  BUN 19  CREATININE 1.41*  CALCIUM 9.3   GFR: Estimated Creatinine Clearance: 37.5 mL/min (A) (by C-G formula based on SCr of 1.41 mg/dL (H)). Liver Function Tests: Recent Labs  Lab 06/19/17 2056  AST 30  ALT 15*  ALKPHOS 45  BILITOT 1.8*  PROT 7.5  ALBUMIN 3.9   No results for input(s): LIPASE, AMYLASE in the last 168 hours. No results for input(s): AMMONIA in the last 168 hours. Coagulation Profile: No results for input(s): INR, PROTIME in the last 168 hours. Cardiac Enzymes: Recent Labs  Lab 06/19/17 2056  TROPONINI <0.03   BNP (last 3 results) No results for input(s): PROBNP in the last 8760 hours. HbA1C: Recent Labs    06/20/17 0359  HGBA1C 6.0*   CBG: Recent Labs  Lab 06/19/17 2031  GLUCAP 90   Lipid Profile: Recent  Labs    06/20/17 0359  CHOL 127  HDL 52  LDLCALC 57  TRIG 90  CHOLHDL 2.4   Thyroid Function Tests: Recent Labs    06/19/17 2027  TSH 2.271   Anemia Panel: No results for input(s): VITAMINB12, FOLATE, FERRITIN, TIBC, IRON, RETICCTPCT in the last 72 hours. Urine analysis:    Component Value Date/Time   COLORURINE YELLOW 06/19/2017 2315   APPEARANCEUR CLEAR 06/19/2017 2315   LABSPEC 1.011 06/19/2017 2315   PHURINE 7.0 06/19/2017 2315   GLUCOSEU NEGATIVE 06/19/2017 2315   HGBUR NEGATIVE 06/19/2017 2315   BILIRUBINUR NEGATIVE 06/19/2017 2315   KETONESUR NEGATIVE 06/19/2017 2315   PROTEINUR NEGATIVE 06/19/2017 2315   NITRITE NEGATIVE 06/19/2017 2315   LEUKOCYTESUR NEGATIVE 06/19/2017 2315   No results found for this or any previous visit (from the past 240 hour(s)).    Radiology Studies: Ct Angio Head W Or Wo Contrast  Result Date: 06/20/2017 CLINICAL DATA:  Follow-up stroke. Small acute left cerebral hemispheric infarcts on MRI. Occluded right ICA on MRA. EXAM: CT ANGIOGRAPHY HEAD AND NECK TECHNIQUE: Multidetector CT imaging of the head and neck was performed using the standard protocol during bolus administration of intravenous contrast. Multiplanar CT image reconstructions and MIPs were obtained to evaluate the vascular anatomy. Carotid stenosis measurements (when applicable) are obtained utilizing NASCET criteria, using the distal internal carotid diameter as the denominator. CONTRAST:  50mL ISOVUE-370 IOPAMIDOL (ISOVUE-370) INJECTION 76% COMPARISON:  Head MRI and MRA 06/20/2017 FINDINGS: CTA NECK FINDINGS Aortic arch: Standard 3 vessel aortic arch with mild arch atherosclerosis. Moderate calcified plaque at the right subclavian artery origin without significant stenosis. Right carotid system: Common carotid artery occlusion just beyond its origin. Reconstitution of small and asymmetrically underopacified internal and external carotid arteries at the bifurcation. Left carotid  system: Patent with bulky calcified plaque at the carotid bifurcation resulting in 60% stenosis of the distal common carotid artery and less than 50% stenosis of the ICA origin. Tortuous mid cervical ICA. Vertebral arteries: Patent and codominant without evidence of significant stenosis or dissection. Skeleton: Severe disc degeneration from C3-4 to C7-T1. C5-6 and C6-7 intervertebral ankylosis. Advanced cervical facet arthrosis. Other neck: Bilateral thyroid nodules measuring up to 1.3 cm on the left. Upper chest: Moderate centrilobular emphysema. Review of the MIP images confirms the above findings CTA HEAD FINDINGS Anterior circulation: The intracranial internal carotid arteries are patent with the right being diffusely small. Minimal nonstenotic plaque is noted in the left cavernous ICA. There is an infundibulum at the right posterior communicating artery origin. ACAs and MCAs are patent without evidence of proximal branch occlusion or significant proximal stenosis. No aneurysm. Posterior circulation: The intracranial vertebral arteries are widely patent to the basilar. Patent right PICA, bilateral AICA, and bilateral SCA origins are identified. The left PICA appears to arise from the proximal basilar artery. The basilar artery is widely patent. There are right larger than left posterior  communicating arteries. PCAs are patent without evidence of significant stenosis. No aneurysm. Venous sinuses: Patent. Anatomic variants: None. Delayed phase: No abnormal enhancement. Review of the MIP images confirms the above findings IMPRESSION: 1. Right common carotid artery occlusion with distal reconstitution of the internal and external carotid arteries. 2. 60% distal left common carotid artery stenosis due to bulky calcified plaque. 3. Widely patent vertebral arteries. 4. Patent circle-of-Willis without proximal branch occlusion or flow limiting proximal stenosis. 5. Aortic Atherosclerosis (ICD10-I70.0) and Emphysema  (ICD10-J43.9). Electronically Signed   By: Sebastian Ache M.D.   On: 06/20/2017 10:46   Dg Chest 2 View  Result Date: 06/19/2017 CLINICAL DATA:  Generalized weakness EXAM: CHEST - 2 VIEW COMPARISON:  None. FINDINGS: Cardiac shadow is within normal limits. Mild linear density is noted in the left base likely representing scarring or atelectasis. Mild degenerative changes of the thoracic spine are noted. IMPRESSION: Changes in the left base likely representing atelectasis and/or scarring. Electronically Signed   By: Alcide Clever M.D.   On: 06/19/2017 21:18   Ct Head Wo Contrast  Result Date: 06/19/2017 CLINICAL DATA:  Acute onset of generalized weakness and altered mental status. EXAM: CT HEAD WITHOUT CONTRAST TECHNIQUE: Contiguous axial images were obtained from the base of the skull through the vertex without intravenous contrast. COMPARISON:  None. FINDINGS: Brain: No evidence of acute infarction, hemorrhage, hydrocephalus, extra-axial collection or mass lesion / mass effect. Prominence of the ventricles and sulci reflects moderate cortical volume loss. Mild cerebellar atrophy is noted. Scattered periventricular and subcortical white matter change likely reflects small vessel ischemic microangiopathy. The brainstem and fourth ventricle are within normal limits. The basal ganglia are unremarkable in appearance. The cerebral hemispheres demonstrate grossly normal gray-white differentiation. No mass effect or midline shift is seen. Vascular: No hyperdense vessel or unexpected calcification. Skull: There is no evidence of fracture; visualized osseous structures are unremarkable in appearance. Sinuses/Orbits: The orbits are within normal limits. The paranasal sinuses and mastoid air cells are well-aerated. Other: No significant soft tissue abnormalities are seen. IMPRESSION: 1. No acute intracranial pathology seen on CT. 2. Moderate cortical volume loss and scattered small vessel ischemic microangiopathy.  Electronically Signed   By: Roanna Raider M.D.   On: 06/19/2017 21:31   Ct Angio Neck W Or Wo Contrast  Result Date: 06/20/2017 CLINICAL DATA:  Follow-up stroke. Small acute left cerebral hemispheric infarcts on MRI. Occluded right ICA on MRA. EXAM: CT ANGIOGRAPHY HEAD AND NECK TECHNIQUE: Multidetector CT imaging of the head and neck was performed using the standard protocol during bolus administration of intravenous contrast. Multiplanar CT image reconstructions and MIPs were obtained to evaluate the vascular anatomy. Carotid stenosis measurements (when applicable) are obtained utilizing NASCET criteria, using the distal internal carotid diameter as the denominator. CONTRAST:  50mL ISOVUE-370 IOPAMIDOL (ISOVUE-370) INJECTION 76% COMPARISON:  Head MRI and MRA 06/20/2017 FINDINGS: CTA NECK FINDINGS Aortic arch: Standard 3 vessel aortic arch with mild arch atherosclerosis. Moderate calcified plaque at the right subclavian artery origin without significant stenosis. Right carotid system: Common carotid artery occlusion just beyond its origin. Reconstitution of small and asymmetrically underopacified internal and external carotid arteries at the bifurcation. Left carotid system: Patent with bulky calcified plaque at the carotid bifurcation resulting in 60% stenosis of the distal common carotid artery and less than 50% stenosis of the ICA origin. Tortuous mid cervical ICA. Vertebral arteries: Patent and codominant without evidence of significant stenosis or dissection. Skeleton: Severe disc degeneration from C3-4 to C7-T1. C5-6 and C6-7  intervertebral ankylosis. Advanced cervical facet arthrosis. Other neck: Bilateral thyroid nodules measuring up to 1.3 cm on the left. Upper chest: Moderate centrilobular emphysema. Review of the MIP images confirms the above findings CTA HEAD FINDINGS Anterior circulation: The intracranial internal carotid arteries are patent with the right being diffusely small. Minimal nonstenotic  plaque is noted in the left cavernous ICA. There is an infundibulum at the right posterior communicating artery origin. ACAs and MCAs are patent without evidence of proximal branch occlusion or significant proximal stenosis. No aneurysm. Posterior circulation: The intracranial vertebral arteries are widely patent to the basilar. Patent right PICA, bilateral AICA, and bilateral SCA origins are identified. The left PICA appears to arise from the proximal basilar artery. The basilar artery is widely patent. There are right larger than left posterior communicating arteries. PCAs are patent without evidence of significant stenosis. No aneurysm. Venous sinuses: Patent. Anatomic variants: None. Delayed phase: No abnormal enhancement. Review of the MIP images confirms the above findings IMPRESSION: 1. Right common carotid artery occlusion with distal reconstitution of the internal and external carotid arteries. 2. 60% distal left common carotid artery stenosis due to bulky calcified plaque. 3. Widely patent vertebral arteries. 4. Patent circle-of-Willis without proximal branch occlusion or flow limiting proximal stenosis. 5. Aortic Atherosclerosis (ICD10-I70.0) and Emphysema (ICD10-J43.9). Electronically Signed   By: Sebastian Ache M.D.   On: 06/20/2017 10:46   Mr Brain Wo Contrast  Result Date: 06/20/2017 CLINICAL DATA:  Initial evaluation for acute right arm and leg weakness, confusion. EXAM: MRI HEAD WITHOUT CONTRAST MRA HEAD WITHOUT CONTRAST TECHNIQUE: Multiplanar, multiecho pulse sequences of the brain and surrounding structures were obtained without intravenous contrast. Angiographic images of the head were obtained using MRA technique without contrast. COMPARISON:  Prior CT from 06/19/2017. FINDINGS: MRI HEAD FINDINGS Brain: Diffuse prominence of the CSF containing spaces compatible with generalized cerebral atrophy. Minimal patchy T2/FLAIR hyperintensity present within the periventricular white matter, likely  related chronic small vessel ischemic changes, mild for age. Small remote lacunar infarcts seen involving the left basal ganglia. Few punctate acute ischemic infarcts seen involving the left basal ganglia and periventricular white matter of the posterior left corona radiata (series 5001, image 63). These measure up to 3 mm. Additional tiny punctate cortical infarct noted posteriorly within the left parietal lobe (series 5001, image 67). No associated hemorrhage or mass effect. No other evidence for acute or subacute ischemia. No evidence for acute or chronic intracranial hemorrhage. No other areas of remote cortical infarction. No mass lesion, midline shift or mass effect. Mild ventricular prominence related to global parenchymal volume loss of hydrocephalus. No extra-axial fluid collection. Major dural sinuses are grossly patent. Pituitary gland suprasellar region normal. Midline structures intact. Vascular: Abnormal flow void within the right ICA to the supraclinoid segment, likely occluded. Major intracranial vascular flow voids otherwise maintained. Skull and upper cervical spine: Craniocervical junction normal. Moderate degenerative spondylolysis within the upper cervical spine with resultant moderate spinal stenosis at C4-5. Bone marrow signal intensity within normal limits. No scalp soft tissue abnormality. Sinuses/Orbits: Globes orbital soft tissues normal. Patient status post lens extraction bilaterally. Scattered mucosal thickening within the ethmoidal sinuses. Paranasal sinuses are otherwise clear. No mastoid effusion. Inner ear structures normal. Other: None. MRA HEAD FINDINGS ANTERIOR CIRCULATION: Right ICA is occluded. Distal reconstitution at the supraclinoid segment via flow cross the circle-of-Willis. Right ophthalmic artery does appear to be grossly patent proximally. Distal cervical left ICA widely patent with antegrade flow. Mild atheromatous irregularity at the cervical petrous junction  without  high-grade stenosis. Petrous, cavernous, and supraclinoid left ICA patent without stenosis. Left ophthalmic artery patent. Left ICA terminus widely patent. A1 segments and anterior communicating artery widely patent with good flow cross the circle-of-Willis. Anterior cerebral arteries well perfused bilaterally. Left M1 segment widely patent. No proximal left M2 occlusion. Right M1 segment perfused without high-grade stenosis. Short-segment moderate right M2 stenosis without occlusion noted. Distal MCA branches fairly symmetric and well perfused bilaterally. Small vessel atheromatous irregularity noted. POSTERIOR CIRCULATION: Vertebral arteries code dominant and patent to the vertebrobasilar junction. Posterior inferior cerebral arteries not assessed on this exam. Basilar artery patent to its distal aspect without stenosis. Superior cerebral arteries patent bilaterally. Motion artifact limits evaluation of the basilar tip and proximal PCAs. Parent signal void at the right P1/P2 junction on reconstructed images favored to be motion related. PCAs well perfused to their distal aspects without stenosis. Bilateral posterior communicating arteries noted, right greater than left. Specifically, the right P com is patent, and likely contribute some flow to the right MCA territory as well. No aneurysm. IMPRESSION: MRI HEAD IMPRESSION 1. Three total punctate subcentimeter acute ischemic nonhemorrhagic infarcts involving the left basal ganglia, left periventricular white matter, and left parietal lobe as above. 2. Age-related cerebral atrophy with mild chronic small vessel ischemic disease. 3. Abnormal flow void within the right ICA, consistent with occlusion (see below). 4. Degenerative spondylolysis at C4-5 with resultant moderate spinal stenosis. MRA HEAD IMPRESSION 1. Occluded right ICA with distal reconstitution at the supraclinoid segment via flow across the circle-of-Willis and/or from the posterior circulation. Right  anterior and middle cerebral arteries are well perfused. 2. No other hemodynamically significant or correctable stenosis within the intracranial circulation. 3. Distal small vessel atheromatous irregularity. Electronically Signed   By: Rise Mu M.D.   On: 06/20/2017 02:57   Mr Shirlee Latch ZO Contrast  Result Date: 06/20/2017 CLINICAL DATA:  Initial evaluation for acute right arm and leg weakness, confusion. EXAM: MRI HEAD WITHOUT CONTRAST MRA HEAD WITHOUT CONTRAST TECHNIQUE: Multiplanar, multiecho pulse sequences of the brain and surrounding structures were obtained without intravenous contrast. Angiographic images of the head were obtained using MRA technique without contrast. COMPARISON:  Prior CT from 06/19/2017. FINDINGS: MRI HEAD FINDINGS Brain: Diffuse prominence of the CSF containing spaces compatible with generalized cerebral atrophy. Minimal patchy T2/FLAIR hyperintensity present within the periventricular white matter, likely related chronic small vessel ischemic changes, mild for age. Small remote lacunar infarcts seen involving the left basal ganglia. Few punctate acute ischemic infarcts seen involving the left basal ganglia and periventricular white matter of the posterior left corona radiata (series 5001, image 63). These measure up to 3 mm. Additional tiny punctate cortical infarct noted posteriorly within the left parietal lobe (series 5001, image 67). No associated hemorrhage or mass effect. No other evidence for acute or subacute ischemia. No evidence for acute or chronic intracranial hemorrhage. No other areas of remote cortical infarction. No mass lesion, midline shift or mass effect. Mild ventricular prominence related to global parenchymal volume loss of hydrocephalus. No extra-axial fluid collection. Major dural sinuses are grossly patent. Pituitary gland suprasellar region normal. Midline structures intact. Vascular: Abnormal flow void within the right ICA to the supraclinoid  segment, likely occluded. Major intracranial vascular flow voids otherwise maintained. Skull and upper cervical spine: Craniocervical junction normal. Moderate degenerative spondylolysis within the upper cervical spine with resultant moderate spinal stenosis at C4-5. Bone marrow signal intensity within normal limits. No scalp soft tissue abnormality. Sinuses/Orbits: Globes orbital soft tissues normal.  Patient status post lens extraction bilaterally. Scattered mucosal thickening within the ethmoidal sinuses. Paranasal sinuses are otherwise clear. No mastoid effusion. Inner ear structures normal. Other: None. MRA HEAD FINDINGS ANTERIOR CIRCULATION: Right ICA is occluded. Distal reconstitution at the supraclinoid segment via flow cross the circle-of-Willis. Right ophthalmic artery does appear to be grossly patent proximally. Distal cervical left ICA widely patent with antegrade flow. Mild atheromatous irregularity at the cervical petrous junction without high-grade stenosis. Petrous, cavernous, and supraclinoid left ICA patent without stenosis. Left ophthalmic artery patent. Left ICA terminus widely patent. A1 segments and anterior communicating artery widely patent with good flow cross the circle-of-Willis. Anterior cerebral arteries well perfused bilaterally. Left M1 segment widely patent. No proximal left M2 occlusion. Right M1 segment perfused without high-grade stenosis. Short-segment moderate right M2 stenosis without occlusion noted. Distal MCA branches fairly symmetric and well perfused bilaterally. Small vessel atheromatous irregularity noted. POSTERIOR CIRCULATION: Vertebral arteries code dominant and patent to the vertebrobasilar junction. Posterior inferior cerebral arteries not assessed on this exam. Basilar artery patent to its distal aspect without stenosis. Superior cerebral arteries patent bilaterally. Motion artifact limits evaluation of the basilar tip and proximal PCAs. Parent signal void at the  right P1/P2 junction on reconstructed images favored to be motion related. PCAs well perfused to their distal aspects without stenosis. Bilateral posterior communicating arteries noted, right greater than left. Specifically, the right P com is patent, and likely contribute some flow to the right MCA territory as well. No aneurysm. IMPRESSION: MRI HEAD IMPRESSION 1. Three total punctate subcentimeter acute ischemic nonhemorrhagic infarcts involving the left basal ganglia, left periventricular white matter, and left parietal lobe as above. 2. Age-related cerebral atrophy with mild chronic small vessel ischemic disease. 3. Abnormal flow void within the right ICA, consistent with occlusion (see below). 4. Degenerative spondylolysis at C4-5 with resultant moderate spinal stenosis. MRA HEAD IMPRESSION 1. Occluded right ICA with distal reconstitution at the supraclinoid segment via flow across the circle-of-Willis and/or from the posterior circulation. Right anterior and middle cerebral arteries are well perfused. 2. No other hemodynamically significant or correctable stenosis within the intracranial circulation. 3. Distal small vessel atheromatous irregularity. Electronically Signed   By: Rise MuBenjamin  McClintock M.D.   On: 06/20/2017 02:57    Scheduled Meds: . aspirin  300 mg Rectal Daily   Or  . aspirin  325 mg Oral Daily  . cholecalciferol  1,000 Units Oral QHS  . [START ON 06/21/2017] clopidogrel  75 mg Oral Daily  . enoxaparin (LOVENOX) injection  40 mg Subcutaneous Q24H  . levothyroxine  25 mcg Oral QAC breakfast  . montelukast  10 mg Oral QHS  . tamsulosin  0.4 mg Oral QHS   Continuous Infusions:   LOS: 0 days   Time spent: 25 minutes.  Hazeline Junkeryan Phuc Kluttz, MD [facility] Pager [phone]  If 7PM-7AM, please contact night-coverage [email or web address] Password [email or web address] 06/20/2017, 2:26 PM

## 2017-06-20 NOTE — Evaluation (Signed)
Physical Therapy Evaluation Patient Details Name: Christian [name] MRN: [id number] DOB: 1923-05-08 Today's Date: 06/20/2017   History of Present Illness  Pt is a 84 y.o. male admitted with AMS and Rt sided weakness.  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 Lt parietal lobe, and Lt periventricular white matter.  PMH includes:  macular degeneration, cataract, CKD, DM     Clinical Impression  Pt presented sitting in recliner chair, awake and willing to participate in therapy session. Pt's spouse present throughout session as well. Prior to admission, pt reported that he was independent with all functional mobility and ADLs. Pt's spouse drives wherever he needs to go. Pt currently able to perform transfers with modified independence and ambulated in hallway with supervision. Pt also completed stair training with supervision, with no concerns. Pt's wife present and stated that she feels pt is back to his baseline. No further acute PT needs identified at this time. PT signing off.     Follow Up Recommendations No PT follow up    Equipment Recommendations  None recommended by PT    Recommendations for Other Services       Precautions / Restrictions Precautions Precautions: None Restrictions Weight Bearing Restrictions: No      Mobility  Bed Mobility               General bed mobility comments: pt OOB in recliner chair upon arrival  Transfers Overall transfer level: Modified independent Equipment used: None                Ambulation/Gait Ambulation/Gait assistance: Supervision Ambulation Distance (Feet): 200 Feet Assistive device: None Gait Pattern/deviations: Step-through pattern;Decreased stride length;Drifts right/left Gait velocity: decreased Gait velocity interpretation: <1.31 ft/sec, indicative of household ambulator General Gait Details: pt modifies his gait and how he navigates his environment due to  his macular degeneration (i.e. - likes to get very close to walls/obstacles but able to avoid colliding with things in his path); no overt LOB or need for physical assistance, supervision for safety  Stairs Stairs: Yes Stairs assistance: Min guard Stair Management: One rail Right;One rail Left;Alternating pattern;Forwards Number of Stairs: 5    Wheelchair Mobility    Modified Rankin (Stroke Patients Only)       Balance Overall balance assessment: Needs assistance Sitting-balance support: Feet supported Sitting balance-Leahy Scale: Normal     Standing balance support: During functional activity;No upper extremity supported Standing balance-Leahy Scale: Good                               Pertinent Vitals/Pain Pain Assessment: No/denies pain    Home Living Family/patient expects to be discharged to:: Private residence Living Arrangements: Spouse/significant other Available Help at Discharge: Family;Available 24 hours/day Type of Home: House Home Access: Stairs to enter Entrance Stairs-Rails: Doctor, general practiceight;Left Entrance Stairs-Number of Steps: 2 Home Layout: One level Home Equipment: Grab bars - tub/shower;Shower seat;Cane - single point      Prior Function Level of Independence: Independent with assistive device(s)         Comments: Does not drive.   ambulates with SPC, wife drives     Hand Dominance   Dominant Hand: Right    Extremity/Trunk Assessment   Upper Extremity Assessment Upper Extremity Assessment: Overall WFL for tasks assessed    Lower Extremity Assessment Lower Extremity Assessment: Overall WFL for tasks assessed    Cervical / Trunk Assessment  Cervical / Trunk Assessment: Normal  Communication   Communication: HOH  Cognition Arousal/Alertness: Awake/alert Behavior During Therapy: WFL for tasks assessed/performed Overall Cognitive Status: Within Functional Limits for tasks assessed                                  General Comments: Pt very talkative       General Comments General comments (skin integrity, edema, etc.): wife present and feels pt is back to baseline     Exercises     Assessment/Plan    PT Assessment Patent does not need any further PT services  PT Problem List         PT Treatment Interventions      PT Goals (Current goals can be found in the Care Plan section)  Acute Rehab PT Goals Patient Stated Goal: to go home    Frequency     Barriers to discharge        Co-evaluation PT/OT/SLP Co-Evaluation/Treatment: Yes Reason for Co-Treatment: To address functional/ADL transfers PT goals addressed during session: Mobility/safety with mobility;Balance;Proper use of DME;Strengthening/ROM OT goals addressed during session: ADL's and self-care       AM-PAC PT "6 Clicks" Daily Activity  Outcome Measure Difficulty turning over in bed (including adjusting bedclothes, sheets and blankets)?: A Little Difficulty moving from lying on back to sitting on the side of the bed? : A Little Difficulty sitting down on and standing up from a chair with arms (e.g., wheelchair, bedside commode, etc,.)?: None Help needed moving to and from a bed to chair (including a wheelchair)?: None Help needed walking in hospital room?: None Help needed climbing 3-5 steps with a railing? : None 6 Click Score: 22    End of Session   Activity Tolerance: Patient tolerated treatment well Patient left: in chair;with call bell/phone within reach;with family/visitor present Nurse Communication: Mobility status PT Visit Diagnosis: Other symptoms and signs involving the nervous system (R29.898)    Time: 1310-1340 PT Time Calculation (min) (ACUTE ONLY): 30 min   Charges:   PT Evaluation $PT Eval Low Complexity: 1 Low     PT G Codes:        [place], PT, [place] [phone]   Alessandra Bevels Emmalynn Pinkham 06/20/2017, 2:43 PM

## 2017-06-20 NOTE — Evaluation (Signed)
Occupational Therapy Evaluation Patient Details Name: Christian BrazenJohn W [name] MRN: [id number] DOB: May 03, 1923 Today's Date: 06/20/2017    History of Present Illness This 84 y.o. male admitted with AMS and Rt sided weakness.  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 Lt parietal lobe, and Lt periventricular white matter.  PMH includes:  macular degeneration, cataract, CKD, DM    Clinical Impression   Patient evaluated by Occupational Therapy with no further acute OT needs identified. All education has been completed and the patient has no further questions. Pt appears to be back to baseline.  He is able to perform ADLs mod I.   Wife present and also feels he is at baseline.   See below for any follow-up Occupational Therapy or equipment needs. OT is signing off. Thank you for this referral.      Follow Up Recommendations  No OT follow up;Supervision - Intermittent    Equipment Recommendations  None recommended by OT    Recommendations for Other Services       Precautions / Restrictions Precautions Precautions: None      Mobility Bed Mobility               General bed mobility comments: up in chair   Transfers Overall transfer level: Modified independent Equipment used: None                  Balance Overall balance assessment: No apparent balance deficits (not formally assessed)                                         ADL either performed or assessed with clinical judgement   ADL Overall ADL's : Modified independent                                             Vision Baseline Vision/History: Macular Degeneration Patient Visual Report: No change from baseline Additional Comments: Vision appears to be at baseline per pt report.  He is able to read large sized print.   He initially hugged the right side of hallway when ambulating, but was able to negotiate obstacles on Lt  and Rt without running into objects      Perception Perception Perception Tested?: Yes   Praxis Praxis Praxis tested?: Within functional limits    Pertinent Vitals/Pain Pain Assessment: No/denies pain     Hand Dominance Right   Extremity/Trunk Assessment Upper Extremity Assessment Upper Extremity Assessment: Overall WFL for tasks assessed   Lower Extremity Assessment Lower Extremity Assessment: Overall WFL for tasks assessed   Cervical / Trunk Assessment Cervical / Trunk Assessment: Normal   Communication Communication Communication: HOH   Cognition Arousal/Alertness: Awake/alert Behavior During Therapy: WFL for tasks assessed/performed Overall Cognitive Status: Within Functional Limits for tasks assessed                                 General Comments: Pt very talkative    General Comments  wife present and feels pt is back to baseline     Exercises     Shoulder Instructions      Home Living Family/patient expects to be discharged to:: Private residence Living Arrangements: Spouse/significant  other Available Help at Discharge: Family;Available 24 hours/day Type of Home: House Home Access: Stairs to enter [employer] of Steps: 2 Entrance Stairs-Rails: Right;Left Home Layout: One level     Bathroom Shower/Tub: Chief Strategy Officer: Handicapped height     Home Equipment: Grab bars - tub/shower;Shower seat;Cane - single point          Prior Functioning/Environment Level of Independence: Independent with assistive device(s)        Comments: Does not drive.   ambulates with SPC, wife drives        OT Problem List: Impaired balance (sitting and/or standing)      OT Treatment/Interventions:      OT Goals(Current goals can be found in the care plan section) Acute Rehab OT Goals Patient Stated Goal: to go outside!  OT Goal Formulation: All assessment and education complete, DC therapy  OT Frequency:      Barriers to D/C:            Co-evaluation PT/OT/SLP Co-Evaluation/Treatment: Yes Reason for Co-Treatment: For patient/therapist safety   OT goals addressed during session: ADL's and self-care      AM-PAC PT "6 Clicks" Daily Activity     Outcome Measure Help from another person eating meals?: None Help from another person taking care of personal grooming?: None Help from another person toileting, which includes using toliet, bedpan, or urinal?: None Help from another person bathing (including washing, rinsing, drying)?: None Help from another person to put on and taking off regular upper body clothing?: None Help from another person to put on and taking off regular lower body clothing?: None 6 Click Score: 24   End of Session Nurse Communication: Mobility status  Activity Tolerance: Patient tolerated treatment well Patient left: in chair;with call bell/phone within reach;with family/visitor present  OT Visit Diagnosis: Unsteadiness on feet (R26.81)                Time: 1610-9604 OT Time Calculation (min): 28 min Charges:  OT General Charges $OT Visit: 1 Visit OT Evaluation $OT Eval Moderate Complexity: 1 Mod G-Codes:     [employer], OTR/L [phone]   Virgina Organ, Aneisa Karren M 06/20/2017, 2:14 PM

## 2017-06-21 ENCOUNTER — Inpatient Hospital Stay (HOSPITAL_COMMUNITY): Payer: Medicare Other

## 2017-06-21 ENCOUNTER — Other Ambulatory Visit (HOSPITAL_COMMUNITY): Payer: Medicare Other

## 2017-06-21 ENCOUNTER — Observation Stay (HOSPITAL_BASED_OUTPATIENT_CLINIC_OR_DEPARTMENT_OTHER): Payer: Medicare Other

## 2017-06-21 DIAGNOSIS — I10 Essential (primary) hypertension: Secondary | ICD-10-CM

## 2017-06-21 DIAGNOSIS — Z974 Presence of external hearing-aid: Secondary | ICD-10-CM | POA: Diagnosis not present

## 2017-06-21 DIAGNOSIS — I6521 Occlusion and stenosis of right carotid artery: Secondary | ICD-10-CM

## 2017-06-21 DIAGNOSIS — I639 Cerebral infarction, unspecified: Secondary | ICD-10-CM

## 2017-06-21 DIAGNOSIS — Z79890 Hormone replacement therapy (postmenopausal): Secondary | ICD-10-CM | POA: Diagnosis not present

## 2017-06-21 DIAGNOSIS — Z887 Allergy status to serum and vaccine status: Secondary | ICD-10-CM | POA: Diagnosis not present

## 2017-06-21 DIAGNOSIS — I129 Hypertensive chronic kidney disease with stage 1 through stage 4 chronic kidney disease, or unspecified chronic kidney disease: Secondary | ICD-10-CM | POA: Diagnosis present

## 2017-06-21 DIAGNOSIS — Z7982 Long term (current) use of aspirin: Secondary | ICD-10-CM | POA: Diagnosis not present

## 2017-06-21 DIAGNOSIS — E039 Hypothyroidism, unspecified: Secondary | ICD-10-CM | POA: Diagnosis present

## 2017-06-21 DIAGNOSIS — I6522 Occlusion and stenosis of left carotid artery: Secondary | ICD-10-CM | POA: Diagnosis not present

## 2017-06-21 DIAGNOSIS — I452 Bifascicular block: Secondary | ICD-10-CM | POA: Diagnosis present

## 2017-06-21 DIAGNOSIS — I4510 Unspecified right bundle-branch block: Secondary | ICD-10-CM | POA: Diagnosis present

## 2017-06-21 DIAGNOSIS — H3530 Unspecified macular degeneration: Secondary | ICD-10-CM | POA: Diagnosis present

## 2017-06-21 DIAGNOSIS — I7389 Other specified peripheral vascular diseases: Secondary | ICD-10-CM | POA: Diagnosis present

## 2017-06-21 DIAGNOSIS — J45909 Unspecified asthma, uncomplicated: Secondary | ICD-10-CM | POA: Diagnosis present

## 2017-06-21 DIAGNOSIS — E1122 Type 2 diabetes mellitus with diabetic chronic kidney disease: Secondary | ICD-10-CM | POA: Diagnosis present

## 2017-06-21 DIAGNOSIS — E1159 Type 2 diabetes mellitus with other circulatory complications: Secondary | ICD-10-CM | POA: Diagnosis not present

## 2017-06-21 DIAGNOSIS — G459 Transient cerebral ischemic attack, unspecified: Secondary | ICD-10-CM | POA: Diagnosis not present

## 2017-06-21 DIAGNOSIS — H9190 Unspecified hearing loss, unspecified ear: Secondary | ICD-10-CM | POA: Diagnosis present

## 2017-06-21 DIAGNOSIS — R29700 NIHSS score 0: Secondary | ICD-10-CM | POA: Diagnosis present

## 2017-06-21 DIAGNOSIS — G8191 Hemiplegia, unspecified affecting right dominant side: Secondary | ICD-10-CM | POA: Diagnosis present

## 2017-06-21 DIAGNOSIS — Z79899 Other long term (current) drug therapy: Secondary | ICD-10-CM | POA: Diagnosis not present

## 2017-06-21 DIAGNOSIS — Z87891 Personal history of nicotine dependence: Secondary | ICD-10-CM | POA: Diagnosis not present

## 2017-06-21 DIAGNOSIS — N183 Chronic kidney disease, stage 3 (moderate): Secondary | ICD-10-CM | POA: Diagnosis present

## 2017-06-21 DIAGNOSIS — I63132 Cerebral infarction due to embolism of left carotid artery: Secondary | ICD-10-CM | POA: Diagnosis present

## 2017-06-21 DIAGNOSIS — N400 Enlarged prostate without lower urinary tract symptoms: Secondary | ICD-10-CM | POA: Diagnosis present

## 2017-06-21 LAB — BASIC METABOLIC PANEL
Anion gap: 8 (ref 5–15)
BUN: 17 mg/dL (ref 6–20)
CO2: 24 mmol/L (ref 22–32)
CREATININE: 1.25 mg/dL — AB (ref 0.61–1.24)
Calcium: 8.8 mg/dL — ABNORMAL LOW (ref 8.9–10.3)
Chloride: 108 mmol/L (ref 101–111)
GFR calc Af Amer: 56 mL/min — ABNORMAL LOW (ref >60)
GFR, EST NON AFRICAN AMERICAN: 49 mL/min — AB (ref >60)
Glucose, Bld: 97 mg/dL (ref 65–99)
Potassium: 3.9 mmol/L (ref 3.5–5.1)
SODIUM: 140 mmol/L (ref 135–145)

## 2017-06-21 LAB — ECHOCARDIOGRAM COMPLETE
HEIGHTINCHES: 72 in
Weight: 2853.63 oz

## 2017-06-21 MED ORDER — ATORVASTATIN CALCIUM 40 MG PO TABS
40.0000 mg | ORAL_TABLET | Freq: Every day | ORAL | Status: DC
  Administered 2017-06-21: 40 mg via ORAL
  Filled 2017-06-21: qty 1

## 2017-06-21 NOTE — Progress Notes (Signed)
PROGRESS NOTE  Christian BrazenJohn W Heinle  RUE:[id number] DOB: 1923/10/27 DOA: 06/19/2017 PCP: Clide DalesWright, Morgan Dionne, PA   Brief Narrative: Christian [name] is a 84 y.o. male with a history of HTN, well-controlled T2DM, and stage III CKD who presented to the ED for right arm and leg weakness and increasing confusion found to have acute subcentimeter ischemic infarcts in the left basal ganglia, parietal lobe, and periventricular white matter on MRI. On arrival he was oriented with resolved neurological deficits. He was admitted for further work up.   Assessment & Plan: Principal Problem:   Acute CVA (cerebrovascular accident) (HCC) Active Problems:   HTN (hypertension)   DM2 (diabetes mellitus, type 2) (HCC)   CKD (chronic kidney disease), stage III (HCC)   Macular degeneration (senile) of retina   BPH (benign prostatic hyperplasia)   Right internal carotid occlusion   Stenosis of left carotid artery  Acute ischemic left CVAs: Suspected to be embolic from left ICA plaque at bifurcation.  - Echocardiogram without cardioembolic source - Continue telemetry - Intracranial evaluation with CTA head/neck per neurology - Started DAPT with ASA + plavix x3 weeks per neurology.  - LDL at goal < 70 (LDL 57, HDL 52). HbA1c 6%.  - PT/OT/SLP: No further therapy needed.  Symptomatic left ICA stenosis:  - Vascular surgery consulted, recommending duplex scan and will discuss the case with Dr. Darrick PennaFields regarding candidacy for stenting/possibility of CEA.   T2DM: Well-controlled with HbA1c of 6% on diet.  - Monitor CBGs prn  HTN:  - Permissive HTN allowed, though remains normotensive despite holding HCTZ  Asthma: No exacerbation, does not even have prn albuterol at home - Singulair  Hypothyroidism: TSH 2.271 - Continue low dose synthroid  BPH: No retention noted - Continue flomax   DVT prophylaxis: Lovenox Code Status: Full Family Communication: None at bedside Disposition Plan: Home when work up  complete  Consultants:   Neurology, Dr. Roda ShuttersXu  Vascular surgery, Dr. Edilia Boickson  Procedures:   Echocardiogram 06/21/2017: Study Conclusions - Left ventricle: The cavity size was normal. Wall thickness was   increased in a pattern of mild LVH. Systolic function was normal.   The estimated ejection fraction was in the range of 60% to 65%.   Wall motion was normal; there were no regional wall motion   abnormalities. Left ventricular diastolic function parameters   were normal. - Atrial septum: No defect or patent foramen ovale was identified. - Pericardium, extracardiac: A trivial pericardial effusion was   identified.  Antimicrobials:  None   Subjective: Symptoms resolved. No new complaints. Wants to go home.   Objective: Vitals:   06/20/17 2350 06/21/17 0425 06/21/17 0856 06/21/17 1100  BP: (!) 106/57 96/62 103/65 108/70  Pulse: 64 70 77 78  Resp: 16 18 18 18   Temp: 98 F (36.7 C) 98.2 F (36.8 C) 98.2 F (36.8 C) 98.4 F (36.9 C)  TempSrc: Oral Oral Oral Oral  SpO2: 96% 96% 97% 99%  Weight:      Height:        Intake/Output Summary (Last 24 hours) at 06/21/2017 1358 Last data filed at 06/20/2017 2300 Gross per 24 hour  Intake 150 ml  Output -  Net 150 ml   Filed Weights   06/19/17 2300 06/19/17 2358  Weight: 78.1 kg (172 lb 2.9 oz) 80.9 kg (178 lb 5.6 oz)    Gen: Pleasant, lively, well-appearing elderly male in no distress eating breakfast Pulm: Non-labored breathing room air. Clear to auscultation bilaterally.  CV: Regular  rate and rhythm. No murmur, rub, or gallop. No JVD, no pedal edema. GI: Abdomen soft, non-tender, non-distended, with normoactive bowel sounds. No organomegaly or masses felt. Ext: Warm, no deformities Skin: No rashes, lesions no ulcers Neuro: Alert and oriented. CN's intact, visual fields full to confrontation, gait normal, neg romberg. No aphasia. No focal neurological deficits. Psych: Judgement and insight appear normal. Mood & affect  appropriate.   Data Reviewed: I have personally reviewed following labs and imaging studies  CBC: Recent Labs  Lab 06/19/17 1508  WBC 6.6  HGB 14.3  HCT 40.9  MCV 86.8  PLT 152   Basic Metabolic Panel: Recent Labs  Lab 06/19/17 1508 06/21/17 0736  NA 139 140  K 3.6 3.9  CL 104 108  CO2 25 24  GLUCOSE 95 97  BUN 19 17  CREATININE 1.41* 1.25*  CALCIUM 9.3 8.8*   GFR: Estimated Creatinine Clearance: 42.2 mL/min (A) (by C-G formula based on SCr of 1.25 mg/dL (H)). Liver Function Tests: Recent Labs  Lab 06/19/17 2056  AST 30  ALT 15*  ALKPHOS 45  BILITOT 1.8*  PROT 7.5  ALBUMIN 3.9   No results for input(s): LIPASE, AMYLASE in the last 168 hours. No results for input(s): AMMONIA in the last 168 hours. Coagulation Profile: No results for input(s): INR, PROTIME in the last 168 hours. Cardiac Enzymes: Recent Labs  Lab 06/19/17 2056  TROPONINI <0.03   BNP (last 3 results) No results for input(s): PROBNP in the last 8760 hours. HbA1C: Recent Labs    06/20/17 0359  HGBA1C 6.0*   CBG: Recent Labs  Lab 06/19/17 2031  GLUCAP 90   Lipid Profile: Recent Labs    06/20/17 0359  CHOL 127  HDL 52  LDLCALC 57  TRIG 90  CHOLHDL 2.4   Thyroid Function Tests: Recent Labs    06/19/17 2027  TSH 2.271   Anemia Panel: No results for input(s): VITAMINB12, FOLATE, FERRITIN, TIBC, IRON, RETICCTPCT in the last 72 hours. Urine analysis:    Component Value Date/Time   COLORURINE YELLOW 06/19/2017 2315   APPEARANCEUR CLEAR 06/19/2017 2315   LABSPEC 1.011 06/19/2017 2315   PHURINE 7.0 06/19/2017 2315   GLUCOSEU NEGATIVE 06/19/2017 2315   HGBUR NEGATIVE 06/19/2017 2315   BILIRUBINUR NEGATIVE 06/19/2017 2315   KETONESUR NEGATIVE 06/19/2017 2315   PROTEINUR NEGATIVE 06/19/2017 2315   NITRITE NEGATIVE 06/19/2017 2315   LEUKOCYTESUR NEGATIVE 06/19/2017 2315   No results found for this or any previous visit (from the past 240 hour(s)).    Radiology  Studies: Ct Angio Head W Or Wo Contrast  Result Date: 06/20/2017 CLINICAL DATA:  Follow-up stroke. Small acute left cerebral hemispheric infarcts on MRI. Occluded right ICA on MRA. EXAM: CT ANGIOGRAPHY HEAD AND NECK TECHNIQUE: Multidetector CT imaging of the head and neck was performed using the standard protocol during bolus administration of intravenous contrast. Multiplanar CT image reconstructions and MIPs were obtained to evaluate the vascular anatomy. Carotid stenosis measurements (when applicable) are obtained utilizing NASCET criteria, using the distal internal carotid diameter as the denominator. CONTRAST:  50mL ISOVUE-370 IOPAMIDOL (ISOVUE-370) INJECTION 76% COMPARISON:  Head MRI and MRA 06/20/2017 FINDINGS: CTA NECK FINDINGS Aortic arch: Standard 3 vessel aortic arch with mild arch atherosclerosis. Moderate calcified plaque at the right subclavian artery origin without significant stenosis. Right carotid system: Common carotid artery occlusion just beyond its origin. Reconstitution of small and asymmetrically underopacified internal and external carotid arteries at the bifurcation. Left carotid system: Patent with bulky calcified  plaque at the carotid bifurcation resulting in 60% stenosis of the distal common carotid artery and less than 50% stenosis of the ICA origin. Tortuous mid cervical ICA. Vertebral arteries: Patent and codominant without evidence of significant stenosis or dissection. Skeleton: Severe disc degeneration from C3-4 to C7-T1. C5-6 and C6-7 intervertebral ankylosis. Advanced cervical facet arthrosis. Other neck: Bilateral thyroid nodules measuring up to 1.3 cm on the left. Upper chest: Moderate centrilobular emphysema. Review of the MIP images confirms the above findings CTA HEAD FINDINGS Anterior circulation: The intracranial internal carotid arteries are patent with the right being diffusely small. Minimal nonstenotic plaque is noted in the left cavernous ICA. There is an  infundibulum at the right posterior communicating artery origin. ACAs and MCAs are patent without evidence of proximal branch occlusion or significant proximal stenosis. No aneurysm. Posterior circulation: The intracranial vertebral arteries are widely patent to the basilar. Patent right PICA, bilateral AICA, and bilateral SCA origins are identified. The left PICA appears to arise from the proximal basilar artery. The basilar artery is widely patent. There are right larger than left posterior communicating arteries. PCAs are patent without evidence of significant stenosis. No aneurysm. Venous sinuses: Patent. Anatomic variants: None. Delayed phase: No abnormal enhancement. Review of the MIP images confirms the above findings IMPRESSION: 1. Right common carotid artery occlusion with distal reconstitution of the internal and external carotid arteries. 2. 60% distal left common carotid artery stenosis due to bulky calcified plaque. 3. Widely patent vertebral arteries. 4. Patent circle-of-Willis without proximal branch occlusion or flow limiting proximal stenosis. 5. Aortic Atherosclerosis (ICD10-I70.0) and Emphysema (ICD10-J43.9). Electronically Signed   By: Sebastian Ache M.D.   On: 06/20/2017 10:46   Dg Chest 2 View  Result Date: 06/19/2017 CLINICAL DATA:  Generalized weakness EXAM: CHEST - 2 VIEW COMPARISON:  None. FINDINGS: Cardiac shadow is within normal limits. Mild linear density is noted in the left base likely representing scarring or atelectasis. Mild degenerative changes of the thoracic spine are noted. IMPRESSION: Changes in the left base likely representing atelectasis and/or scarring. Electronically Signed   By: Alcide Clever M.D.   On: 06/19/2017 21:18   Ct Head Wo Contrast  Result Date: 06/19/2017 CLINICAL DATA:  Acute onset of generalized weakness and altered mental status. EXAM: CT HEAD WITHOUT CONTRAST TECHNIQUE: Contiguous axial images were obtained from the base of the skull through the vertex  without intravenous contrast. COMPARISON:  None. FINDINGS: Brain: No evidence of acute infarction, hemorrhage, hydrocephalus, extra-axial collection or mass lesion / mass effect. Prominence of the ventricles and sulci reflects moderate cortical volume loss. Mild cerebellar atrophy is noted. Scattered periventricular and subcortical white matter change likely reflects small vessel ischemic microangiopathy. The brainstem and fourth ventricle are within normal limits. The basal ganglia are unremarkable in appearance. The cerebral hemispheres demonstrate grossly normal gray-white differentiation. No mass effect or midline shift is seen. Vascular: No hyperdense vessel or unexpected calcification. Skull: There is no evidence of fracture; visualized osseous structures are unremarkable in appearance. Sinuses/Orbits: The orbits are within normal limits. The paranasal sinuses and mastoid air cells are well-aerated. Other: No significant soft tissue abnormalities are seen. IMPRESSION: 1. No acute intracranial pathology seen on CT. 2. Moderate cortical volume loss and scattered small vessel ischemic microangiopathy. Electronically Signed   By: Roanna Raider M.D.   On: 06/19/2017 21:31   Ct Angio Neck W Or Wo Contrast  Result Date: 06/20/2017 CLINICAL DATA:  Follow-up stroke. Small acute left cerebral hemispheric infarcts on MRI. Occluded right  ICA on MRA. EXAM: CT ANGIOGRAPHY HEAD AND NECK TECHNIQUE: Multidetector CT imaging of the head and neck was performed using the standard protocol during bolus administration of intravenous contrast. Multiplanar CT image reconstructions and MIPs were obtained to evaluate the vascular anatomy. Carotid stenosis measurements (when applicable) are obtained utilizing NASCET criteria, using the distal internal carotid diameter as the denominator. CONTRAST:  50mL ISOVUE-370 IOPAMIDOL (ISOVUE-370) INJECTION 76% COMPARISON:  Head MRI and MRA 06/20/2017 FINDINGS: CTA NECK FINDINGS Aortic arch:  Standard 3 vessel aortic arch with mild arch atherosclerosis. Moderate calcified plaque at the right subclavian artery origin without significant stenosis. Right carotid system: Common carotid artery occlusion just beyond its origin. Reconstitution of small and asymmetrically underopacified internal and external carotid arteries at the bifurcation. Left carotid system: Patent with bulky calcified plaque at the carotid bifurcation resulting in 60% stenosis of the distal common carotid artery and less than 50% stenosis of the ICA origin. Tortuous mid cervical ICA. Vertebral arteries: Patent and codominant without evidence of significant stenosis or dissection. Skeleton: Severe disc degeneration from C3-4 to C7-T1. C5-6 and C6-7 intervertebral ankylosis. Advanced cervical facet arthrosis. Other neck: Bilateral thyroid nodules measuring up to 1.3 cm on the left. Upper chest: Moderate centrilobular emphysema. Review of the MIP images confirms the above findings CTA HEAD FINDINGS Anterior circulation: The intracranial internal carotid arteries are patent with the right being diffusely small. Minimal nonstenotic plaque is noted in the left cavernous ICA. There is an infundibulum at the right posterior communicating artery origin. ACAs and MCAs are patent without evidence of proximal branch occlusion or significant proximal stenosis. No aneurysm. Posterior circulation: The intracranial vertebral arteries are widely patent to the basilar. Patent right PICA, bilateral AICA, and bilateral SCA origins are identified. The left PICA appears to arise from the proximal basilar artery. The basilar artery is widely patent. There are right larger than left posterior communicating arteries. PCAs are patent without evidence of significant stenosis. No aneurysm. Venous sinuses: Patent. Anatomic variants: None. Delayed phase: No abnormal enhancement. Review of the MIP images confirms the above findings IMPRESSION: 1. Right common carotid  artery occlusion with distal reconstitution of the internal and external carotid arteries. 2. 60% distal left common carotid artery stenosis due to bulky calcified plaque. 3. Widely patent vertebral arteries. 4. Patent circle-of-Willis without proximal branch occlusion or flow limiting proximal stenosis. 5. Aortic Atherosclerosis (ICD10-I70.0) and Emphysema (ICD10-J43.9). Electronically Signed   By: Sebastian Ache M.D.   On: 06/20/2017 10:46   Mr Brain Wo Contrast  Result Date: 06/20/2017 CLINICAL DATA:  Initial evaluation for acute right arm and leg weakness, confusion. EXAM: MRI HEAD WITHOUT CONTRAST MRA HEAD WITHOUT CONTRAST TECHNIQUE: Multiplanar, multiecho pulse sequences of the brain and surrounding structures were obtained without intravenous contrast. Angiographic images of the head were obtained using MRA technique without contrast. COMPARISON:  Prior CT from 06/19/2017. FINDINGS: MRI HEAD FINDINGS Brain: Diffuse prominence of the CSF containing spaces compatible with generalized cerebral atrophy. Minimal patchy T2/FLAIR hyperintensity present within the periventricular white matter, likely related chronic small vessel ischemic changes, mild for age. Small remote lacunar infarcts seen involving the left basal ganglia. Few punctate acute ischemic infarcts seen involving the left basal ganglia and periventricular white matter of the posterior left corona radiata (series 5001, image 63). These measure up to 3 mm. Additional tiny punctate cortical infarct noted posteriorly within the left parietal lobe (series 5001, image 67). No associated hemorrhage or mass effect. No other evidence for acute or subacute ischemia. No  evidence for acute or chronic intracranial hemorrhage. No other areas of remote cortical infarction. No mass lesion, midline shift or mass effect. Mild ventricular prominence related to global parenchymal volume loss of hydrocephalus. No extra-axial fluid collection. Major dural sinuses are  grossly patent. Pituitary gland suprasellar region normal. Midline structures intact. Vascular: Abnormal flow void within the right ICA to the supraclinoid segment, likely occluded. Major intracranial vascular flow voids otherwise maintained. Skull and upper cervical spine: Craniocervical junction normal. Moderate degenerative spondylolysis within the upper cervical spine with resultant moderate spinal stenosis at C4-5. Bone marrow signal intensity within normal limits. No scalp soft tissue abnormality. Sinuses/Orbits: Globes orbital soft tissues normal. Patient status post lens extraction bilaterally. Scattered mucosal thickening within the ethmoidal sinuses. Paranasal sinuses are otherwise clear. No mastoid effusion. Inner ear structures normal. Other: None. MRA HEAD FINDINGS ANTERIOR CIRCULATION: Right ICA is occluded. Distal reconstitution at the supraclinoid segment via flow cross the circle-of-Willis. Right ophthalmic artery does appear to be grossly patent proximally. Distal cervical left ICA widely patent with antegrade flow. Mild atheromatous irregularity at the cervical petrous junction without high-grade stenosis. Petrous, cavernous, and supraclinoid left ICA patent without stenosis. Left ophthalmic artery patent. Left ICA terminus widely patent. A1 segments and anterior communicating artery widely patent with good flow cross the circle-of-Willis. Anterior cerebral arteries well perfused bilaterally. Left M1 segment widely patent. No proximal left M2 occlusion. Right M1 segment perfused without high-grade stenosis. Short-segment moderate right M2 stenosis without occlusion noted. Distal MCA branches fairly symmetric and well perfused bilaterally. Small vessel atheromatous irregularity noted. POSTERIOR CIRCULATION: Vertebral arteries code dominant and patent to the vertebrobasilar junction. Posterior inferior cerebral arteries not assessed on this exam. Basilar artery patent to its distal aspect without  stenosis. Superior cerebral arteries patent bilaterally. Motion artifact limits evaluation of the basilar tip and proximal PCAs. Parent signal void at the right P1/P2 junction on reconstructed images favored to be motion related. PCAs well perfused to their distal aspects without stenosis. Bilateral posterior communicating arteries noted, right greater than left. Specifically, the right P com is patent, and likely contribute some flow to the right MCA territory as well. No aneurysm. IMPRESSION: MRI HEAD IMPRESSION 1. Three total punctate subcentimeter acute ischemic nonhemorrhagic infarcts involving the left basal ganglia, left periventricular white matter, and left parietal lobe as above. 2. Age-related cerebral atrophy with mild chronic small vessel ischemic disease. 3. Abnormal flow void within the right ICA, consistent with occlusion (see below). 4. Degenerative spondylolysis at C4-5 with resultant moderate spinal stenosis. MRA HEAD IMPRESSION 1. Occluded right ICA with distal reconstitution at the supraclinoid segment via flow across the circle-of-Willis and/or from the posterior circulation. Right anterior and middle cerebral arteries are well perfused. 2. No other hemodynamically significant or correctable stenosis within the intracranial circulation. 3. Distal small vessel atheromatous irregularity. Electronically Signed   By: Rise Mu M.D.   On: 06/20/2017 02:57   Mr Shirlee Latch ZO Contrast  Result Date: 06/20/2017 CLINICAL DATA:  Initial evaluation for acute right arm and leg weakness, confusion. EXAM: MRI HEAD WITHOUT CONTRAST MRA HEAD WITHOUT CONTRAST TECHNIQUE: Multiplanar, multiecho pulse sequences of the brain and surrounding structures were obtained without intravenous contrast. Angiographic images of the head were obtained using MRA technique without contrast. COMPARISON:  Prior CT from 06/19/2017. FINDINGS: MRI HEAD FINDINGS Brain: Diffuse prominence of the CSF containing spaces  compatible with generalized cerebral atrophy. Minimal patchy T2/FLAIR hyperintensity present within the periventricular white matter, likely related chronic small vessel ischemic changes, mild  for age. Small remote lacunar infarcts seen involving the left basal ganglia. Few punctate acute ischemic infarcts seen involving the left basal ganglia and periventricular white matter of the posterior left corona radiata (series 5001, image 63). These measure up to 3 mm. Additional tiny punctate cortical infarct noted posteriorly within the left parietal lobe (series 5001, image 67). No associated hemorrhage or mass effect. No other evidence for acute or subacute ischemia. No evidence for acute or chronic intracranial hemorrhage. No other areas of remote cortical infarction. No mass lesion, midline shift or mass effect. Mild ventricular prominence related to global parenchymal volume loss of hydrocephalus. No extra-axial fluid collection. Major dural sinuses are grossly patent. Pituitary gland suprasellar region normal. Midline structures intact. Vascular: Abnormal flow void within the right ICA to the supraclinoid segment, likely occluded. Major intracranial vascular flow voids otherwise maintained. Skull and upper cervical spine: Craniocervical junction normal. Moderate degenerative spondylolysis within the upper cervical spine with resultant moderate spinal stenosis at C4-5. Bone marrow signal intensity within normal limits. No scalp soft tissue abnormality. Sinuses/Orbits: Globes orbital soft tissues normal. Patient status post lens extraction bilaterally. Scattered mucosal thickening within the ethmoidal sinuses. Paranasal sinuses are otherwise clear. No mastoid effusion. Inner ear structures normal. Other: None. MRA HEAD FINDINGS ANTERIOR CIRCULATION: Right ICA is occluded. Distal reconstitution at the supraclinoid segment via flow cross the circle-of-Willis. Right ophthalmic artery does appear to be grossly patent  proximally. Distal cervical left ICA widely patent with antegrade flow. Mild atheromatous irregularity at the cervical petrous junction without high-grade stenosis. Petrous, cavernous, and supraclinoid left ICA patent without stenosis. Left ophthalmic artery patent. Left ICA terminus widely patent. A1 segments and anterior communicating artery widely patent with good flow cross the circle-of-Willis. Anterior cerebral arteries well perfused bilaterally. Left M1 segment widely patent. No proximal left M2 occlusion. Right M1 segment perfused without high-grade stenosis. Short-segment moderate right M2 stenosis without occlusion noted. Distal MCA branches fairly symmetric and well perfused bilaterally. Small vessel atheromatous irregularity noted. POSTERIOR CIRCULATION: Vertebral arteries code dominant and patent to the vertebrobasilar junction. Posterior inferior cerebral arteries not assessed on this exam. Basilar artery patent to its distal aspect without stenosis. Superior cerebral arteries patent bilaterally. Motion artifact limits evaluation of the basilar tip and proximal PCAs. Parent signal void at the right P1/P2 junction on reconstructed images favored to be motion related. PCAs well perfused to their distal aspects without stenosis. Bilateral posterior communicating arteries noted, right greater than left. Specifically, the right P com is patent, and likely contribute some flow to the right MCA territory as well. No aneurysm. IMPRESSION: MRI HEAD IMPRESSION 1. Three total punctate subcentimeter acute ischemic nonhemorrhagic infarcts involving the left basal ganglia, left periventricular white matter, and left parietal lobe as above. 2. Age-related cerebral atrophy with mild chronic small vessel ischemic disease. 3. Abnormal flow void within the right ICA, consistent with occlusion (see below). 4. Degenerative spondylolysis at C4-5 with resultant moderate spinal stenosis. MRA HEAD IMPRESSION 1. Occluded right  ICA with distal reconstitution at the supraclinoid segment via flow across the circle-of-Willis and/or from the posterior circulation. Right anterior and middle cerebral arteries are well perfused. 2. No other hemodynamically significant or correctable stenosis within the intracranial circulation. 3. Distal small vessel atheromatous irregularity. Electronically Signed   By: Rise Mu M.D.   On: 06/20/2017 02:57    Scheduled Meds: . aspirin EC  81 mg Oral Daily  . cholecalciferol  1,000 Units Oral QHS  . clopidogrel  75 mg Oral Daily  .  enoxaparin (LOVENOX) injection  40 mg Subcutaneous Q24H  . levothyroxine  25 mcg Oral QAC breakfast  . montelukast  10 mg Oral QHS  . tamsulosin  0.4 mg Oral QHS    Time spent: 25 minutes.  Hazeline Junker, MD [facility] Hospitalists Pager [phone]  If 7PM-7AM, please contact night-coverage [email or web address] Password [email or web address] 06/21/2017, 1:58 PM

## 2017-06-21 NOTE — Consult Note (Addendum)
Patient name: Christian [name] MRN: [id number] DOB: 1923/04/25 Sex: male  REASON FOR CONSULT:    Left brain stroke.  The consult is requested by Dr. Jarvis Newcomer  HPI:   Christian [name] is a pleasant 84 y.o. male, who was admitted on the 19th with a left brain stroke.  His presenting symptoms were altered mental status and weakness.  However his wife apparently describes some right arm and right leg weakness that occurred at approximately 11 AM on the 19th.  On my history he denies any focal weakness or paresthesias but states that he had weakness in his lower chest and abdomen.  The patient is right-handed.  He denies any previous history of stroke, TIAs, expressive or receptive aphasia, or amaurosis fugax.  He was on 81 mg of aspirin prior to admission.  According to the records he has a history of diabetes and hypertension although he denies this.  He denies any family history of premature cardiovascular disease.  He quit tobacco in 1980.  Of note the situation is further complicated in that he has an adopted daughter who recently died and is being buried Advertising account executive.  Past Medical History:  Diagnosis Date  . BPH (benign prostatic hyperplasia)   . Cataract   . CKD (chronic kidney disease)   . DM2 (diabetes mellitus, type 2) (HCC)   . HTN (hypertension)   . Macular degeneration (senile) of retina   . RBBB with left anterior fascicular block     History reviewed. No pertinent family history.  He denies any family history of premature cardiovascular disease.  SOCIAL HISTORY: He quit tobacco in 1980. Social History   Socioeconomic History  . Marital status: Divorced    Spouse name: Not on file  . Number of children: Not on file  . Years of education: Not on file  . Highest education level: Not on file  Occupational History  . Not on file  Social Needs  . Financial resource strain: Not on file  . Food insecurity:    Worry: Not on file    Inability: Not on file  . Transportation  needs:    Medical: Not on file    Non-medical: Not on file  Tobacco Use  . Smoking status: Former Games developer  . Smokeless tobacco: Never Used  Substance and Sexual Activity  . Alcohol use: No    Alcohol/week: 0.0 oz  . Drug use: No  . Sexual activity: Not on file  Lifestyle  . Physical activity:    Days per week: Not on file    Minutes per session: Not on file  . Stress: Not on file  Relationships  . Social connections:    Talks on phone: Not on file    Gets together: Not on file    Attends religious service: Not on file    Active member of club or organization: Not on file    Attends meetings of clubs or organizations: Not on file    Relationship status: Not on file  . Intimate partner violence:    Fear of current or ex partner: Not on file    Emotionally abused: Not on file    Physically abused: Not on file    Forced sexual activity: Not on file  Other Topics Concern  . Not on file  Social History Narrative  . Not on file    Allergies  Allergen Reactions  . Tetanus Toxoid, Adsorbed Other (See Comments)    Patient doesn't recall reaction  .  Tetanus Toxoids Other (See Comments)    Patient doesn't recall reaction    Current Facility-Administered Medications  Medication Dose Route Frequency Provider Last Rate Last Dose  . acetaminophen (TYLENOL) tablet 650 mg  650 mg Oral Q4H PRN Hillary Bow, DO       Or  . acetaminophen (TYLENOL) solution 650 mg  650 mg Per Tube Q4H PRN Hillary Bow, DO       Or  . acetaminophen (TYLENOL) suppository 650 mg  650 mg Rectal Q4H PRN Hillary Bow, DO      . aspirin EC tablet 81 mg  81 mg Oral Daily Marvel Plan, MD   81 mg at 06/21/17 0811  . cholecalciferol (VITAMIN D) tablet 1,000 Units  1,000 Units Oral QHS Hillary Bow, DO   1,000 Units at 06/20/17 2212  . clopidogrel (PLAVIX) tablet 75 mg  75 mg Oral Daily Rejeana Brock, MD   75 mg at 06/21/17 1610  . enoxaparin (LOVENOX) injection 40 mg  40 mg Subcutaneous  Q24H Lyda Perone M, DO   40 mg at 06/21/17 9604  . levothyroxine (SYNTHROID, LEVOTHROID) tablet 25 mcg  25 mcg Oral QAC breakfast Hillary Bow, DO   25 mcg at 06/21/17 0810  . montelukast (SINGULAIR) tablet 10 mg  10 mg Oral QHS Lyda Perone M, DO   10 mg at 06/20/17 2212  . senna-docusate (Senokot-S) tablet 1 tablet  1 tablet Oral QHS PRN Hillary Bow, DO      . tamsulosin [facility]) capsule 0.4 mg  0.4 mg Oral QHS Lyda Perone M, DO   0.4 mg at 06/20/17 2212    REVIEW OF SYSTEMS:  [X]  denotes positive finding, [ ]  denotes negative finding Cardiac  Comments:  Chest pain or chest pressure:    Shortness of breath upon exertion:    Short of breath when lying flat:    Irregular heart rhythm:        Vascular    Pain in calf, thigh, or hip brought on by ambulation:    Pain in feet at night that wakes you up from your sleep:     Blood clot in your veins:    Leg swelling:         Pulmonary    Oxygen at home:    Productive cough:     Wheezing:         Neurologic    Sudden weakness in arms or legs:  x   Sudden numbness in arms or legs:  x   Sudden onset of difficulty speaking or slurred speech:    Temporary loss of vision in one eye:     Problems with dizziness:         Gastrointestinal    Blood in stool:     Vomited blood:         Genitourinary    Burning when urinating:     Blood in urine:        Psychiatric    Major depression:         Hematologic    Bleeding problems:    Problems with blood clotting too easily:        Skin    Rashes or ulcers:        Constitutional    Fever or chills:     PHYSICAL EXAM:   Vitals:   06/20/17 2012 06/20/17 2350 06/21/17 0425 06/21/17 0856  BP: (!) 143/75 (!) 106/57 96/62 103/65  Pulse: 74  64 70 77  Resp: 18 16 18 18   Temp: 97.7 F (36.5 C) 98 F (36.7 C) 98.2 F (36.8 C) 98.2 F (36.8 C)  TempSrc: Oral Oral Oral Oral  SpO2: 99% 96% 96% 97%  Weight:      Height:        GENERAL: The patient is a  well-nourished male, in no acute distress. The vital signs are documented above. CARDIAC: There is a regular rate and rhythm.  VASCULAR: He does have a left carotid bruit. He has palpable femoral, popliteal, and dorsalis pedis pulses bilaterally. He has no significant lower extremity swelling. PULMONARY: There is good air exchange bilaterally without wheezing or rales. ABDOMEN: Soft and non-tender with normal pitched bowel sounds.  MUSCULOSKELETAL: There are no major deformities or cyanosis. NEUROLOGIC: No focal weakness or paresthesias are detected. SKIN: There are no ulcers or rashes noted. PSYCHIATRIC: The patient has a normal affect.  DATA:    CT HEAD: CT of the head on 06/19/2017 showed no acute intracranial pathology.  CT ANGIOS NECK: I reviewed the CT angiogram of the neck that was done yesterday.  There is a right common carotid artery occlusion with distal reconstitution of the internal carotid artery to external collaterals.  There is a 60% distal left common carotid artery stenosis due to a bulky calcific plaque.  No branch vessel occlusion is noted intracranially.  MRI BRAIN: This shows 3 punctate acute ischemic nonhemorrhagic infarcts involving the left basal ganglia and left periventricular white matter and left parietal lobe.  MRA HEAD: The right common carotid artery is occluded.  There is no significant intracranial disease.  ECHO: These results are pending.  MEDICAL ISSUES:   SYMPTOMATIC MODERATE LEFT CAROTID STENOSIS: By CT scan the patient has an occluded right common carotid artery and a 60% stenosis of the left carotid bifurcation.  He has a bulky calcific plaque at the left carotid bifurcation that is likely the source of his embolic stroke.  The bifurcation is somewhat high.  I will order a carotid duplex scan to further evaluate the severity of the stenosis.  However, given the findings on MR I and this bulky calcific plaque at the left carotid bifurcation I think  consideration should be given to carotid stenting or left carotid endarterectomy.  Carotid stenting is associated with a slightly high risk of stroke in elderly patients.  In addition he has some mild chronic kidney disease with a GFR of 56.  However the bifurcation appears high and carotid endarterectomy may be technically challenging.  The alternative approach would be maximal medical therapy.  Plavix has been added this admission.  I would also consider adding a statin.  I will review the films with Dr. Fabienne Brunsharles Fields to see if he is a candidate for carotid stenting.  In addition I will follow-up on his carotid duplex scan.  We will make further recommendations tomorrow.  Waverly Ferrarihristopher Sahith Nurse [facility] [phone]

## 2017-06-21 NOTE — Progress Notes (Signed)
STROKE TEAM PROGRESS NOTE   SUBJECTIVE (INTERVAL HISTORY) Christian [name] family is at the bedside. Pt is sitting in bed for lunch. Dr. Edilia Bo has seen him and will need CUS and further discussion with Dr. Darrick Penna regarding best approach. He has Christian [name] acute event overnight.   OBJECTIVE Temp:  [97.7 F (36.5 C)-98.4 F (36.9 C)] 98.4 F (36.9 C) (04/21 1100) Pulse Rate:  [64-78] 78 (04/21 1100) Cardiac Rhythm: Normal sinus rhythm (04/21 0730) Resp:  [16-18] 18 (04/21 1100) BP: (96-143)/(57-75) 108/70 (04/21 1100) SpO2:  [96 %-99 %] 99 % (04/21 1100)  CBC:  Recent Labs  Lab 06/19/17 1508  WBC 6.6  HGB 14.3  HCT 40.9  MCV 86.8  PLT 152    Basic Metabolic Panel:  Recent Labs  Lab 06/19/17 1508 06/21/17 0736  NA 139 140  K 3.6 3.9  CL 104 108  CO2 25 24  GLUCOSE 95 97  BUN 19 17  CREATININE 1.41* 1.25*  CALCIUM 9.3 8.8*    Lipid Panel:     Component Value Date/Time   CHOL 127 06/20/2017 0359   TRIG 90 06/20/2017 0359   HDL 52 06/20/2017 0359   CHOLHDL 2.4 06/20/2017 0359   VLDL 18 06/20/2017 0359   LDLCALC 57 06/20/2017 0359   HgbA1c:  Lab Results  Component Value Date   HGBA1C 6.0 (H) 06/20/2017   Urine Drug Screen: Christian [name] results found for: LABOPIA, COCAINSCRNUR, LABBENZ, AMPHETMU, THCU, LABBARB  Alcohol Level Christian [name] results found for: [facility]  IMAGING I have personally reviewed the radiological images below and agree with the radiology interpretations. On history of a Ct Head Wo Contrast 06/19/2017 IMPRESSION:  1. Christian [name] acute intracranial pathology seen on CT.  2. Moderate cortical volume loss and scattered small vessel ischemic microangiopathy.   CTA H&N 06/20/2017 IMPRESSION: 1. Right common carotid artery occlusion with distal reconstitution of the internal and external carotid arteries. 2. 60% distal left common carotid artery stenosis due to bulky calcified plaque. 3. Widely patent vertebral arteries. 4. Patent circle-of-Willis without proximal branch occlusion or flow  limiting proximal stenosis. 5. Aortic Atherosclerosis (ICD10-I70.0) and Emphysema (ICD10-J43.9).  Mr Christian Christian [name] Head Wo Contrast 06/20/2017 IMPRESSION:   MRI HEAD  1. Three total punctate subcentimeter acute ischemic nonhemorrhagic infarcts involving the left basal ganglia, left periventricular white matter, and left parietal lobe as above.  2. Age-related cerebral atrophy with mild chronic small vessel ischemic disease.  3. Abnormal flow void within the right ICA, consistent with occlusion (see below).  4. Degenerative spondylolysis at C4-5 with resultant moderate spinal stenosis.   MRA HEAD  1. Occluded right ICA with distal reconstitution at the supraclinoid segment via flow across the circle-of-Willis and/or from the posterior circulation. Right anterior and middle cerebral arteries are well perfused.  2. Christian [name] other hemodynamically significant or correctable stenosis within the intracranial circulation.  3. Distal small vessel atheromatous irregularity.   TTE - Left ventricle: The cavity size was normal. Wall thickness was   increased in a pattern of mild LVH. Systolic function was normal.   The estimated ejection fraction was in the range of 60% to 65%.   Wall motion was normal; there were Christian [name] regional wall motion   abnormalities. Left ventricular diastolic function parameters   were normal. - Atrial septum: Christian [name] defect or patent foramen ovale was identified. - Pericardium, extracardiac: A trivial pericardial effusion was   identified.  CUS - Right ICA occlusion beginning at bifurcation. Patent right ECA. Left ICA 40-59% stenosis with significant plaque at bifurcation.  Left ECA >50% stenosis.    PHYSICAL EXAM  Temp:  [97.7 F (36.5 C)-98.4 F (36.9 C)] 98.4 F (36.9 C) (04/21 1100) Pulse Rate:  [64-78] 78 (04/21 1100) Resp:  [16-18] 18 (04/21 1100) BP: (96-143)/(57-75) 108/70 (04/21 1100) SpO2:  [96 %-99 %] 99 % (04/21 1100)  General - Well nourished, well developed, in Christian [name]  apparent distress.  Ophthalmologic - Fundi not visualized due to eye movement.  Cardiovascular - Regular rate and rhythm.  Mental Status -  Level of arousal and orientation to time, place, and person were intact. Language including expression, naming, repetition, comprehension was assessed and found intact. Fund of Knowledge was assessed and was intact.  Cranial Nerves II - XII - II - Visual field intact OU. III, IV, VI - Extraocular movements intact. V - Facial sensation intact bilaterally. VII - Facial movement intact bilaterally. VIII - Hearing & vestibular intact bilaterally. X - Palate elevates symmetrically. XI - Chin turning & shoulder shrug intact bilaterally. XII - Tongue protrusion intact.  Motor Strength - The patient's strength was normal in all extremities and pronator drift was absent.  Bulk was normal and fasciculations were absent.   Motor Tone - Muscle tone was assessed at the neck and appendages and was normal.  Reflexes - The patient's reflexes were 1+ in all extremities and he had Christian [name] pathological reflexes.  Sensory - Light touch, temperature/pinprick, vibration and proprioception, and Romberg testing were assessed and were symmetrical.    Coordination - The patient had normal movements in the hands and feet with Christian [name] ataxia or dysmetria.  Tremor was absent.  Gait and Station - The patient's transfers, posture, gait, station, and turns were observed as normal.   ASSESSMENT/PLAN Mr. Christian Christian [name] is a 84 y.o. male with history of right bundle branch block, hypertension, diabetes,  chronic kidney disease, and BPH presenting with transient right-sided weakness and confusion.Marland Kitchen. He did not receive IV t-PA due to late presentation.  Stroke: multiple left subcortical and periventricular punctate infarcts likely A-A emboli from left ICA stenosis with extensive plaque  Resultant deficit resolved  CT head - Christian [name] acute intracranial pathology seen on CT.   MRI head - Three  total punctate subcentimeter acute ischemic nonhemorrhagic infarcts involving the left basal ganglia, left periventricular white matter, and left parietal lobe as above.   MRA head - Occluded right ICA  CTA H&N -right ICA chronic occlusion, left ICA proximal 60% with extensive atherosclerosis  2D Echo EF 60-65%  CUS - right ICA occluded and left ICA 40-59% stenosis with significant palque at bifurcation  LDL - 57  HgbA1c - 6.0  VTE prophylaxis - Lovenox Fall precautions Diet Carb Modified Fluid consistency: Thin; Room service appropriate? Yes  aspirin 81 mg daily prior to admission, now on aspirin 325 mg daily and clopidogrel 75 mg daily  Patient counseled to be compliant with his antithrombotic medications  Ongoing aggressive stroke risk factor management  Therapy recommendations:  pending  Disposition:  Pending  Carotid artery occlusion/stenosis  Right ICA chronic occlusion  Left ICA 60% stenosis with extensive atherosclerosis  CUS - right ICA occluded and left ICA 40-59% stenosis with significant palque at bifurcation  Current stroke likely due to left ICA symptomatic stenosis  Appreciate Dr. Edilia Boickson recommendations  Defer to VVS for consideration of CEA vs CAS  Hypertension  Stable  Permissive hypertension (OK if < 220/120) but gradually normalize in 5-7 days . Long-term BP goal 130-1 50 due to extracranial carotid occlusion/stenosis  Hyperlipidemia  Lipid lowering medication PTA:  none  LDL 57, goal < 70  Current lipid lowering medication:  lipitor 40  Continue statin at discharge  Diabetes  HgbA1c 6.0, goal < 7.0  Controlled  SSI  CBG monitoring  Other Stroke Risk Factors  Advanced age  Former cigarette smoker - quit  Other Active Problems  BPH  Creatinine 1.41  Hospital day # 0  Neurology will sign off. Please call with questions. Pt will follow up with stroke clinic NP at [facility] in about 4 weeks. Thanks for the  consult.   Marvel Plan, MD PhD Stroke Neurology 06/21/2017 3:46 PM    To contact Stroke Continuity provider, please refer to [email or web address]. After hours, contact General Neurology

## 2017-06-21 NOTE — Progress Notes (Signed)
  Echocardiogram 2D Echocardiogram has been performed.  Christian [name] T Laiden Milles 06/21/2017, 10:25 AM

## 2017-06-21 NOTE — Progress Notes (Signed)
Carotid duplex prelim: Right ICA occlusion beginning at bifurcation. Patent right ECA. Left ICA 40-59% stenosis with significant plaque at bifurcation. Left ECA >50% stenosis.  Farrel DemarkJill Eunice, RDMS, RVT

## 2017-06-21 NOTE — Plan of Care (Signed)
  Problem: Health Behavior/Discharge Planning: Goal: Ability to manage health-related needs will improve Outcome: Progressing   Problem: Education: Goal: Knowledge of General Education information will improve Outcome: Progressing   Problem: Clinical Measurements: Goal: Ability to maintain clinical measurements within normal limits will improve Outcome: Progressing   Problem: Clinical Measurements: Goal: Will remain free from infection Outcome: Progressing   Problem: Clinical Measurements: Goal: Diagnostic test results will improve Outcome: Progressing   Problem: Clinical Measurements: Goal: Respiratory complications will improve Outcome: Progressing   Problem: Elimination: Goal: Will not experience complications related to bowel motility Outcome: Progressing

## 2017-06-22 DIAGNOSIS — H3530 Unspecified macular degeneration: Secondary | ICD-10-CM

## 2017-06-22 DIAGNOSIS — N183 Chronic kidney disease, stage 3 (moderate): Secondary | ICD-10-CM

## 2017-06-22 MED ORDER — ATORVASTATIN CALCIUM 40 MG PO TABS: 40.0000 mg | ORAL_TABLET | Freq: Every day | ORAL | 0 refills | Status: DC

## 2017-06-22 MED ORDER — CLOPIDOGREL BISULFATE 75 MG PO TABS: 75.0000 mg | ORAL_TABLET | Freq: Every day | ORAL | 0 refills | Status: DC

## 2017-06-22 NOTE — Progress Notes (Signed)
PROGRESS NOTE  Christian [name]  ZHY:[id number] DOB: October 13, 1923 DOA: 06/19/2017 PCP: Clide DalesWright, Morgan Dionne, PA   Brief Narrative: Christian [name] is a 84 y.o. male with a history of HTN, well-controlled T2DM, and stage III CKD who presented to the ED for right arm and leg weakness and increasing confusion found to have acute subcentimeter ischemic infarcts in the left basal ganglia, parietal lobe, and periventricular white matter on MRI. On arrival he was oriented with resolved neurological deficits. He was admitted for further work up.   Assessment & Plan: Principal Problem:   Acute CVA (cerebrovascular accident) (HCC) Active Problems:   HTN (hypertension)   DM2 (diabetes mellitus, type 2) (HCC)   CKD (chronic kidney disease), stage III (HCC)   Macular degeneration (senile) of retina   BPH (benign prostatic hyperplasia)   Right internal carotid occlusion   Stenosis of left carotid artery  Acute ischemic left CVAs: Suspected to be embolic from left ICA plaque at bifurcation.  - Echocardiogram without cardioembolic source - Continue telemetry - Intracranial evaluation with CTA head/neck per neurology - Started DAPT with ASA + plavix x3 weeks per neurology.  - LDL at goal < 70 (LDL 57, HDL 52). HbA1c 6%.  - PT/OT/SLP: No further therapy needed.  Symptomatic left ICA stenosis:  - Vascular surgery consulted, await plan from their standpoint. Carotid U/S read this morning by Dr. Edilia Boickson. 40-59% left ICA stenosis, >50% ECA stenosis.   T2DM: Well-controlled with HbA1c of 6% on diet.  - Monitor CBGs prn  HTN:  - Permissive HTN allowed, though remains normotensive despite holding HCTZ  Asthma: No exacerbation, does not even have prn albuterol at home - Singulair  Hypothyroidism: TSH 2.271 - Continue low dose synthroid  BPH: No retention noted - Continue flomax   DVT prophylaxis: Lovenox Code Status: Full Family Communication: None at bedside Disposition Plan: Home when work up  complete, pending vascular surgery plan.  Consultants:   Neurology, Dr. Roda ShuttersXu  Vascular surgery, Dr. Edilia Boickson  Procedures:   Echocardiogram 06/21/2017: Study Conclusions - Left ventricle: The cavity size was normal. Wall thickness was   increased in a pattern of mild LVH. Systolic function was normal.   The estimated ejection fraction was in the range of 60% to 65%.   Wall motion was normal; there were no regional wall motion   abnormalities. Left ventricular diastolic function parameters   were normal. - Atrial septum: No defect or patent foramen ovale was identified. - Pericardium, extracardiac: A trivial pericardial effusion was   identified.  Antimicrobials:  None   Subjective: Symptoms resolved. No new complaints. Adopted daughter's funeral is today at 2pm. AWaiting vascular surgery deciison.   Objective: Vitals:   06/21/17 1953 06/21/17 2356 06/22/17 0403 06/22/17 0742  BP: (!) 127/102 119/70 130/75 123/75  Pulse: 77 69 65 71  Resp: 16 18 20 20   Temp: 98.4 F (36.9 C) 98.3 F (36.8 C) 97.7 F (36.5 C) 97.8 F (36.6 C)  TempSrc: Oral Oral Oral Oral  SpO2: 100% 98% 100% 100%  Weight:      Height:        Intake/Output Summary (Last 24 hours) at 06/22/2017 1057 Last data filed at 06/21/2017 2300 Gross per 24 hour  Intake 300 ml  Output -  Net 300 ml   Filed Weights   06/19/17 2300 06/19/17 2358  Weight: 78.1 kg (172 lb 2.9 oz) 80.9 kg (178 lb 5.6 oz)    Gen: Pleasant, lively, well-appearing elderly male in no distress  Pulm: Non-labored Ext: Warm, no deformities Skin: No rashes, lesions no ulcers Neuro: Alert and oriented. very HOH otherwise CN's intact, No aphasia. No focal neurological deficits. Psych: Judgement and insight appear normal. Mood & affect appropriate.   Data Reviewed: I have personally reviewed following labs and imaging studies  CBC: Recent Labs  Lab 06/19/17 1508  WBC 6.6  HGB 14.3  HCT 40.9  MCV 86.8  PLT 152   Basic Metabolic  Panel: Recent Labs  Lab 06/19/17 1508 06/21/17 0736  NA 139 140  K 3.6 3.9  CL 104 108  CO2 25 24  GLUCOSE 95 97  BUN 19 17  CREATININE 1.41* 1.25*  CALCIUM 9.3 8.8*   GFR: Estimated Creatinine Clearance: 42.2 mL/min (A) (by C-G formula based on SCr of 1.25 mg/dL (H)). Liver Function Tests: Recent Labs  Lab 06/19/17 2056  AST 30  ALT 15*  ALKPHOS 45  BILITOT 1.8*  PROT 7.5  ALBUMIN 3.9   No results for input(s): LIPASE, AMYLASE in the last 168 hours. No results for input(s): AMMONIA in the last 168 hours. Coagulation Profile: No results for input(s): INR, PROTIME in the last 168 hours. Cardiac Enzymes: Recent Labs  Lab 06/19/17 2056  TROPONINI <0.03   BNP (last 3 results) No results for input(s): PROBNP in the last 8760 hours. HbA1C: Recent Labs    06/20/17 0359  HGBA1C 6.0*   CBG: Recent Labs  Lab 06/19/17 2031  GLUCAP 90   Lipid Profile: Recent Labs    06/20/17 0359  CHOL 127  HDL 52  LDLCALC 57  TRIG 90  CHOLHDL 2.4   Thyroid Function Tests: Recent Labs    06/19/17 2027  TSH 2.271   Anemia Panel: No results for input(s): VITAMINB12, FOLATE, FERRITIN, TIBC, IRON, RETICCTPCT in the last 72 hours. Urine analysis:    Component Value Date/Time   COLORURINE YELLOW 06/19/2017 2315   APPEARANCEUR CLEAR 06/19/2017 2315   LABSPEC 1.011 06/19/2017 2315   PHURINE 7.0 06/19/2017 2315   GLUCOSEU NEGATIVE 06/19/2017 2315   HGBUR NEGATIVE 06/19/2017 2315   BILIRUBINUR NEGATIVE 06/19/2017 2315   KETONESUR NEGATIVE 06/19/2017 2315   PROTEINUR NEGATIVE 06/19/2017 2315   NITRITE NEGATIVE 06/19/2017 2315   LEUKOCYTESUR NEGATIVE 06/19/2017 2315   No results found for this or any previous visit (from the past 240 hour(s)).    Radiology Studies: No results found.  Scheduled Meds: . aspirin EC  81 mg Oral Daily  . atorvastatin  40 mg Oral q1800  . cholecalciferol  1,000 Units Oral QHS  . clopidogrel  75 mg Oral Daily  . enoxaparin (LOVENOX)  injection  40 mg Subcutaneous Q24H  . levothyroxine  25 mcg Oral QAC breakfast  . montelukast  10 mg Oral QHS  . tamsulosin  0.4 mg Oral QHS    Time spent: 15 minutes.  Hazeline Junker, MD [facility] Pager [phone]  If 7PM-7AM, please contact night-coverage [email or web address] Password [facility] 06/22/2017, 10:57 AM

## 2017-06-22 NOTE — Care Management Note (Signed)
Case Management Note  Patient Details  Name: Carron BrazenJohn W Morneault MRN: [id number] Date of Birth: May 09, 1923  Subjective/Objective:                    Action/Plan: Pt discharging home with self care. Pt has PCP, insurance and transportation home.    Expected Discharge Date:  06/22/17               Expected Discharge Plan:  Home/Self Care  In-House Referral:     Discharge planning Services     Post Acute Care Choice:    Choice offered to:     DME Arranged:    DME Agency:     HH Arranged:    HH Agency:     Status of Service:  Completed, signed off  If discussed at [employer] Length [employer] Stay Meetings, dates discussed:    Additional Comments:  Kermit BaloKelli F Quashon Jesus, RN 06/22/2017, 4:19 PM

## 2017-06-22 NOTE — Progress Notes (Signed)
   VASCULAR SURGERY ASSESSMENT & PLAN:   No new neurologic symptoms.  I have again explained to the patient that the options are maximal medical therapy (plavix has been added), carotid stenting, or carotid endarterectomy.  I have reviewed the films with Dr. Fabienne Brunsharles Fields who feels he is not a good candidate for carotid stenting given the amount of calcific disease.  Carotid endarterectomy would be associated with increased risk given his age and also the fact that the carotid bifurcation is quite high and could make it more technically challenging.  All things considered I think the safest option would be maximal medical therapy which she is now on with Plavix and only consider left carotid endarterectomy if he develops recurrent symptoms.  From our standpoint he can be discharged on Plavix and I will arrange follow-up with me in 3-4 weeks.  I have discussed this with the patient who is agreeable.   SUBJECTIVE:   No specific complaints.  PHYSICAL EXAM:   Vitals:   06/21/17 2356 06/22/17 0403 06/22/17 0742 06/22/17 1219  BP: 119/70 130/75 123/75 140/80  Pulse: 69 65 71 81  Resp: 18 20 20 20   Temp: 98.3 F (36.8 C) 97.7 F (36.5 C) 97.8 F (36.6 C) 97.6 F (36.4 C)  TempSrc: Oral Oral Oral Oral  SpO2: 98% 100% 100% 100%  Weight:      Height:       No focal weakness or paresthesias.  LABS:   Lab Results  Component Value Date   WBC 6.6 06/19/2017   HGB 14.3 06/19/2017   HCT 40.9 06/19/2017   MCV 86.8 06/19/2017   PLT 152 06/19/2017   Lab Results  Component Value Date   CREATININE 1.25 (H) 06/21/2017   No results found for: INR, PROTIME CBG (last 3)  Recent Labs    06/19/17 2031  GLUCAP 90    PROBLEM LIST:    Principal Problem:   Acute CVA (cerebrovascular accident) (HCC) Active Problems:   HTN (hypertension)   DM2 (diabetes mellitus, type 2) (HCC)   CKD (chronic kidney disease), stage III (HCC)   Macular degeneration (senile) of retina   BPH (benign  prostatic hyperplasia)   Right internal carotid occlusion   Stenosis of left carotid artery   CURRENT MEDS:   . aspirin EC  81 mg Oral Daily  . atorvastatin  40 mg Oral q1800  . cholecalciferol  1,000 Units Oral QHS  . clopidogrel  75 mg Oral Daily  . enoxaparin (LOVENOX) injection  40 mg Subcutaneous Q24H  . levothyroxine  25 mcg Oral QAC breakfast  . montelukast  10 mg Oral QHS  . tamsulosin  0.4 mg Oral QHS    Christian [name] Beeper: [phone] Office: [phone] 06/22/2017

## 2017-06-22 NOTE — Discharge Summary (Signed)
Physician Discharge Summary  Christian [name] ZOX:[id number] DOB: 1923-05-09 DOA: 06/19/2017  PCP: Clide DalesWright, Morgan Dionne, PA  Admit date: 06/19/2017 Discharge date: 06/22/2017  Admitted From: Home Disposition: Home   Recommendations for Outpatient Follow-up:  1. Follow up with PCP in 1-2 weeks 2. Follow up with neurology in 4 weeks (referral made) 3. Follow up with vascular surgery, Dr. Edilia Boickson.  Home Health: None Equipment/Devices: None Discharge Condition: Stable CODE STATUS: Full Diet recommendation: Heart healthy, carb-modified  Brief/Interim Summary: Christian BrazenJohn W Betker is a 84 y.o. male with a history of HTN, well-controlled T2DM, and stage III CKD who presented to the ED for right arm and leg weakness and increasing confusion found to have acute subcentimeter ischemic infarcts in the left basal ganglia, parietal lobe, and periventricular white matter on MRI. On arrival he was oriented with resolved neurological deficits. He was admitted for further work up which revealed chronically occluded right ICA and 60% left ICA stenosis. Vascular surgery was consulted per request of stroke neurologist. Carotid U/S demonstrated 40-59% left ICA stenosis, >50% ECA stenosis.   Discharge Diagnoses:  Principal Problem:   Acute CVA (cerebrovascular accident) (HCC) Active Problems:   HTN (hypertension)   DM2 (diabetes mellitus, type 2) (HCC)   CKD (chronic kidney disease), stage III (HCC)   Macular degeneration (senile) of retina   BPH (benign prostatic hyperplasia)   Right internal carotid occlusion   Stenosis of left carotid artery  Acute ischemic left CVAs: Suspected to be embolic from left ICA plaque at bifurcation.  - Echocardiogram without cardioembolic source. No significant telemetry findings.  - Started DAPT with ASA 325mg  + plavix per neurology.  - LDL at goal < 70 (LDL 57, HDL 52). HbA1c 6%.  - PT/OT/SLP: No further therapy needed.  Symptomatic left ICA stenosis: Carotid U/S read 4/22  by Dr. Edilia Boickson. 40-59% left ICA stenosis, >50% ECA stenosis.  - Vascular surgery consulted, recommended maximal medical therapy unless pt develops recurrent symptoms given poor surgical options. Not a good candidate for stenting and location is superior enough to make CEA technically challenging.   T2DM: Well-controlled with HbA1c of 6% on diet.  - Monitor CBGs prn  HTN:  - Permissive HTN allowed, though remains normotensive despite holding HCTZ  Asthma: No exacerbation, does not even have prn albuterol at home - Singulair  Hypothyroidism: TSH 2.271 - Continue low dose synthroid  BPH: No retention noted - Continue flomax   Discharge Instructions Discharge Instructions    Ambulatory referral to Neurology   Complete by:  As directed    Follow up with stroke clinic NP (Jessica Vanschaick or Darrol Angelarolyn Martin, if both not available, consider Manson AllanSethi, Penumali, or Ahern) [facility] in about 4 weeks. Thanks.   Diet - low sodium heart healthy   Complete by:  As directed    Discharge instructions   Complete by:  As directed    You were evaluated for a small stroke and luckily have had your symptoms resolved.  - To reduce your risk of stroke, continue taking aspirin and start taking plavix and lipitor (sent to your pharmacy) - If your symptoms return, seek medical attention right away.   Increase activity slowly   Complete by:  As directed      Allergies as of 06/22/2017      Reactions   Tetanus Toxoid, Adsorbed Other (See Comments)   Patient doesn't recall reaction   Tetanus Toxoids Other (See Comments)   Patient doesn't recall reaction      Medication  List    TAKE these medications   aspirin EC 81 MG tablet Take 81 mg by mouth at bedtime.   atorvastatin 40 MG tablet Commonly known as:  LIPITOR Take 1 tablet (40 mg total) by mouth daily at 6 PM.   CALCIUM 500+D3 PO Take 1 tablet by mouth daily.   clopidogrel 75 MG tablet Commonly known as:  PLAVIX Take 1 tablet (75 mg  total) by mouth daily.   hydrochlorothiazide 25 MG tablet Commonly known as:  HYDRODIURIL Take 25 mg by mouth daily.   levothyroxine 25 MCG tablet Commonly known as:  SYNTHROID, LEVOTHROID Take 25 mcg by mouth daily before breakfast.   montelukast 10 MG tablet Commonly known as:  SINGULAIR Take 10 mg by mouth at bedtime.   PROBIOTIC DAILY PO Take 1 capsule by mouth daily.   tamsulosin 0.4 MG Caps capsule Commonly known as:  FLOMAX Take 0.4 mg by mouth at bedtime.   Vitamin D3 1000 units Caps Take 1,000 Units by mouth at bedtime.      Follow-up Information    [facility]. Schedule an appointment as soon as possible for a visit in 4 week(s).   Specialty:  Radiology Contact information: [address] [place] [address] [phone]       Clide Dales, [place] Follow up.   Specialty:  Family Medicine Contact information: [address] [place] [address] [phone]        Chuck Hint, MD Follow up.   Specialties:  Vascular Surgery, Cardiology Contact information: [address] [place] [address] [phone]          Allergies  Allergen Reactions  . Tetanus Toxoid, Adsorbed Other (See Comments)    Patient doesn't recall reaction  . Tetanus Toxoids Other (See Comments)    Patient doesn't recall reaction    Consultations:  Neurology, Dr. Roda Shutters  Vascular surgery, Dr. Edilia Bo  Procedures/Studies: Ct Angio Head W Or Wo Contrast  Result Date: 06/20/2017 CLINICAL DATA:  Follow-up stroke. Small acute left cerebral hemispheric infarcts on MRI. Occluded right ICA on MRA. EXAM: CT ANGIOGRAPHY HEAD AND NECK TECHNIQUE: Multidetector CT imaging of the head and neck was performed using the standard protocol during bolus administration of intravenous contrast. Multiplanar CT image reconstructions and MIPs were obtained to evaluate the vascular anatomy. Carotid stenosis  measurements (when applicable) are obtained utilizing NASCET criteria, using the distal internal carotid diameter as the denominator. CONTRAST:  50mL ISOVUE-370 IOPAMIDOL (ISOVUE-370) INJECTION 76% COMPARISON:  Head MRI and MRA 06/20/2017 FINDINGS: CTA NECK FINDINGS Aortic arch: Standard 3 vessel aortic arch with mild arch atherosclerosis. Moderate calcified plaque at the right subclavian artery origin without significant stenosis. Right carotid system: Common carotid artery occlusion just beyond its origin. Reconstitution of small and asymmetrically underopacified internal and external carotid arteries at the bifurcation. Left carotid system: Patent with bulky calcified plaque at the carotid bifurcation resulting in 60% stenosis of the distal common carotid artery and less than 50% stenosis of the ICA origin. Tortuous mid cervical ICA. Vertebral arteries: Patent and codominant without evidence of significant stenosis or dissection. Skeleton: Severe disc degeneration from C3-4 to C7-T1. C5-6 and C6-7 intervertebral ankylosis. Advanced cervical facet arthrosis. Other neck: Bilateral thyroid nodules measuring up to 1.3 cm on the left. Upper chest: Moderate centrilobular emphysema. Review of the MIP images confirms the above findings CTA HEAD FINDINGS Anterior circulation: The intracranial internal carotid arteries are patent with the right being diffusely  small. Minimal nonstenotic plaque is noted in the left cavernous ICA. There is an infundibulum at the right posterior communicating artery origin. ACAs and MCAs are patent without evidence of proximal branch occlusion or significant proximal stenosis. No aneurysm. Posterior circulation: The intracranial vertebral arteries are widely patent to the basilar. Patent right PICA, bilateral AICA, and bilateral SCA origins are identified. The left PICA appears to arise from the proximal basilar artery. The basilar artery is widely patent. There are right larger than left  posterior communicating arteries. PCAs are patent without evidence of significant stenosis. No aneurysm. Venous sinuses: Patent. Anatomic variants: None. Delayed phase: No abnormal enhancement. Review of the MIP images confirms the above findings IMPRESSION: 1. Right common carotid artery occlusion with distal reconstitution of the internal and external carotid arteries. 2. 60% distal left common carotid artery stenosis due to bulky calcified plaque. 3. Widely patent vertebral arteries. 4. Patent circle-of-Willis without proximal branch occlusion or flow limiting proximal stenosis. 5. Aortic Atherosclerosis (ICD10-I70.0) and Emphysema (ICD10-J43.9). Electronically Signed   By: Sebastian Ache M.D.   On: 06/20/2017 10:46   Dg Chest 2 View  Result Date: 06/19/2017 CLINICAL DATA:  Generalized weakness EXAM: CHEST - 2 VIEW COMPARISON:  None. FINDINGS: Cardiac shadow is within normal limits. Mild linear density is noted in the left base likely representing scarring or atelectasis. Mild degenerative changes of the thoracic spine are noted. IMPRESSION: Changes in the left base likely representing atelectasis and/or scarring. Electronically Signed   By: Alcide Clever M.D.   On: 06/19/2017 21:18   Ct Head Wo Contrast  Result Date: 06/19/2017 CLINICAL DATA:  Acute onset of generalized weakness and altered mental status. EXAM: CT HEAD WITHOUT CONTRAST TECHNIQUE: Contiguous axial images were obtained from the base of the skull through the vertex without intravenous contrast. COMPARISON:  None. FINDINGS: Brain: No evidence of acute infarction, hemorrhage, hydrocephalus, extra-axial collection or mass lesion / mass effect. Prominence of the ventricles and sulci reflects moderate cortical volume loss. Mild cerebellar atrophy is noted. Scattered periventricular and subcortical white matter change likely reflects small vessel ischemic microangiopathy. The brainstem and fourth ventricle are within normal limits. The basal ganglia  are unremarkable in appearance. The cerebral hemispheres demonstrate grossly normal gray-white differentiation. No mass effect or midline shift is seen. Vascular: No hyperdense vessel or unexpected calcification. Skull: There is no evidence of fracture; visualized osseous structures are unremarkable in appearance. Sinuses/Orbits: The orbits are within normal limits. The paranasal sinuses and mastoid air cells are well-aerated. Other: No significant soft tissue abnormalities are seen. IMPRESSION: 1. No acute intracranial pathology seen on CT. 2. Moderate cortical volume loss and scattered small vessel ischemic microangiopathy. Electronically Signed   By: Roanna Raider M.D.   On: 06/19/2017 21:31   Ct Angio Neck W Or Wo Contrast  Result Date: 06/20/2017 CLINICAL DATA:  Follow-up stroke. Small acute left cerebral hemispheric infarcts on MRI. Occluded right ICA on MRA. EXAM: CT ANGIOGRAPHY HEAD AND NECK TECHNIQUE: Multidetector CT imaging of the head and neck was performed using the standard protocol during bolus administration of intravenous contrast. Multiplanar CT image reconstructions and MIPs were obtained to evaluate the vascular anatomy. Carotid stenosis measurements (when applicable) are obtained utilizing NASCET criteria, using the distal internal carotid diameter as the denominator. CONTRAST:  50mL ISOVUE-370 IOPAMIDOL (ISOVUE-370) INJECTION 76% COMPARISON:  Head MRI and MRA 06/20/2017 FINDINGS: CTA NECK FINDINGS Aortic arch: Standard 3 vessel aortic arch with mild arch atherosclerosis. Moderate calcified plaque at the right subclavian artery origin  without significant stenosis. Right carotid system: Common carotid artery occlusion just beyond its origin. Reconstitution of small and asymmetrically underopacified internal and external carotid arteries at the bifurcation. Left carotid system: Patent with bulky calcified plaque at the carotid bifurcation resulting in 60% stenosis of the distal common carotid  artery and less than 50% stenosis of the ICA origin. Tortuous mid cervical ICA. Vertebral arteries: Patent and codominant without evidence of significant stenosis or dissection. Skeleton: Severe disc degeneration from C3-4 to C7-T1. C5-6 and C6-7 intervertebral ankylosis. Advanced cervical facet arthrosis. Other neck: Bilateral thyroid nodules measuring up to 1.3 cm on the left. Upper chest: Moderate centrilobular emphysema. Review of the MIP images confirms the above findings CTA HEAD FINDINGS Anterior circulation: The intracranial internal carotid arteries are patent with the right being diffusely small. Minimal nonstenotic plaque is noted in the left cavernous ICA. There is an infundibulum at the right posterior communicating artery origin. ACAs and MCAs are patent without evidence of proximal branch occlusion or significant proximal stenosis. No aneurysm. Posterior circulation: The intracranial vertebral arteries are widely patent to the basilar. Patent right PICA, bilateral AICA, and bilateral SCA origins are identified. The left PICA appears to arise from the proximal basilar artery. The basilar artery is widely patent. There are right larger than left posterior communicating arteries. PCAs are patent without evidence of significant stenosis. No aneurysm. Venous sinuses: Patent. Anatomic variants: None. Delayed phase: No abnormal enhancement. Review of the MIP images confirms the above findings IMPRESSION: 1. Right common carotid artery occlusion with distal reconstitution of the internal and external carotid arteries. 2. 60% distal left common carotid artery stenosis due to bulky calcified plaque. 3. Widely patent vertebral arteries. 4. Patent circle-of-Willis without proximal branch occlusion or flow limiting proximal stenosis. 5. Aortic Atherosclerosis (ICD10-I70.0) and Emphysema (ICD10-J43.9). Electronically Signed   By: Sebastian Ache M.D.   On: 06/20/2017 10:46   Mr Brain Wo Contrast  Result Date:  06/20/2017 CLINICAL DATA:  Initial evaluation for acute right arm and leg weakness, confusion. EXAM: MRI HEAD WITHOUT CONTRAST MRA HEAD WITHOUT CONTRAST TECHNIQUE: Multiplanar, multiecho pulse sequences of the brain and surrounding structures were obtained without intravenous contrast. Angiographic images of the head were obtained using MRA technique without contrast. COMPARISON:  Prior CT from 06/19/2017. FINDINGS: MRI HEAD FINDINGS Brain: Diffuse prominence of the CSF containing spaces compatible with generalized cerebral atrophy. Minimal patchy T2/FLAIR hyperintensity present within the periventricular white matter, likely related chronic small vessel ischemic changes, mild for age. Small remote lacunar infarcts seen involving the left basal ganglia. Few punctate acute ischemic infarcts seen involving the left basal ganglia and periventricular white matter of the posterior left corona radiata (series 5001, image 63). These measure up to 3 mm. Additional tiny punctate cortical infarct noted posteriorly within the left parietal lobe (series 5001, image 67). No associated hemorrhage or mass effect. No other evidence for acute or subacute ischemia. No evidence for acute or chronic intracranial hemorrhage. No other areas of remote cortical infarction. No mass lesion, midline shift or mass effect. Mild ventricular prominence related to global parenchymal volume loss of hydrocephalus. No extra-axial fluid collection. Major dural sinuses are grossly patent. Pituitary gland suprasellar region normal. Midline structures intact. Vascular: Abnormal flow void within the right ICA to the supraclinoid segment, likely occluded. Major intracranial vascular flow voids otherwise maintained. Skull and upper cervical spine: Craniocervical junction normal. Moderate degenerative spondylolysis within the upper cervical spine with resultant moderate spinal stenosis at C4-5. Bone marrow signal intensity within normal  limits. No scalp soft  tissue abnormality. Sinuses/Orbits: Globes orbital soft tissues normal. Patient status post lens extraction bilaterally. Scattered mucosal thickening within the ethmoidal sinuses. Paranasal sinuses are otherwise clear. No mastoid effusion. Inner ear structures normal. Other: None. MRA HEAD FINDINGS ANTERIOR CIRCULATION: Right ICA is occluded. Distal reconstitution at the supraclinoid segment via flow cross the circle-of-Willis. Right ophthalmic artery does appear to be grossly patent proximally. Distal cervical left ICA widely patent with antegrade flow. Mild atheromatous irregularity at the cervical petrous junction without high-grade stenosis. Petrous, cavernous, and supraclinoid left ICA patent without stenosis. Left ophthalmic artery patent. Left ICA terminus widely patent. A1 segments and anterior communicating artery widely patent with good flow cross the circle-of-Willis. Anterior cerebral arteries well perfused bilaterally. Left M1 segment widely patent. No proximal left M2 occlusion. Right M1 segment perfused without high-grade stenosis. Short-segment moderate right M2 stenosis without occlusion noted. Distal MCA branches fairly symmetric and well perfused bilaterally. Small vessel atheromatous irregularity noted. POSTERIOR CIRCULATION: Vertebral arteries code dominant and patent to the vertebrobasilar junction. Posterior inferior cerebral arteries not assessed on this exam. Basilar artery patent to its distal aspect without stenosis. Superior cerebral arteries patent bilaterally. Motion artifact limits evaluation of the basilar tip and proximal PCAs. Parent signal void at the right P1/P2 junction on reconstructed images favored to be motion related. PCAs well perfused to their distal aspects without stenosis. Bilateral posterior communicating arteries noted, right greater than left. Specifically, the right P com is patent, and likely contribute some flow to the right MCA territory as well. No aneurysm.  IMPRESSION: MRI HEAD IMPRESSION 1. Three total punctate subcentimeter acute ischemic nonhemorrhagic infarcts involving the left basal ganglia, left periventricular white matter, and left parietal lobe as above. 2. Age-related cerebral atrophy with mild chronic small vessel ischemic disease. 3. Abnormal flow void within the right ICA, consistent with occlusion (see below). 4. Degenerative spondylolysis at C4-5 with resultant moderate spinal stenosis. MRA HEAD IMPRESSION 1. Occluded right ICA with distal reconstitution at the supraclinoid segment via flow across the circle-of-Willis and/or from the posterior circulation. Right anterior and middle cerebral arteries are well perfused. 2. No other hemodynamically significant or correctable stenosis within the intracranial circulation. 3. Distal small vessel atheromatous irregularity. Electronically Signed   By: Rise Mu M.D.   On: 06/20/2017 02:57   Mr Shirlee Latch ON Contrast  Result Date: 06/20/2017 CLINICAL DATA:  Initial evaluation for acute right arm and leg weakness, confusion. EXAM: MRI HEAD WITHOUT CONTRAST MRA HEAD WITHOUT CONTRAST TECHNIQUE: Multiplanar, multiecho pulse sequences of the brain and surrounding structures were obtained without intravenous contrast. Angiographic images of the head were obtained using MRA technique without contrast. COMPARISON:  Prior CT from 06/19/2017. FINDINGS: MRI HEAD FINDINGS Brain: Diffuse prominence of the CSF containing spaces compatible with generalized cerebral atrophy. Minimal patchy T2/FLAIR hyperintensity present within the periventricular white matter, likely related chronic small vessel ischemic changes, mild for age. Small remote lacunar infarcts seen involving the left basal ganglia. Few punctate acute ischemic infarcts seen involving the left basal ganglia and periventricular white matter of the posterior left corona radiata (series 5001, image 63). These measure up to 3 mm. Additional tiny punctate  cortical infarct noted posteriorly within the left parietal lobe (series 5001, image 67). No associated hemorrhage or mass effect. No other evidence for acute or subacute ischemia. No evidence for acute or chronic intracranial hemorrhage. No other areas of remote cortical infarction. No mass lesion, midline shift or mass effect. Mild ventricular prominence related to  global parenchymal volume loss of hydrocephalus. No extra-axial fluid collection. Major dural sinuses are grossly patent. Pituitary gland suprasellar region normal. Midline structures intact. Vascular: Abnormal flow void within the right ICA to the supraclinoid segment, likely occluded. Major intracranial vascular flow voids otherwise maintained. Skull and upper cervical spine: Craniocervical junction normal. Moderate degenerative spondylolysis within the upper cervical spine with resultant moderate spinal stenosis at C4-5. Bone marrow signal intensity within normal limits. No scalp soft tissue abnormality. Sinuses/Orbits: Globes orbital soft tissues normal. Patient status post lens extraction bilaterally. Scattered mucosal thickening within the ethmoidal sinuses. Paranasal sinuses are otherwise clear. No mastoid effusion. Inner ear structures normal. Other: None. MRA HEAD FINDINGS ANTERIOR CIRCULATION: Right ICA is occluded. Distal reconstitution at the supraclinoid segment via flow cross the circle-of-Willis. Right ophthalmic artery does appear to be grossly patent proximally. Distal cervical left ICA widely patent with antegrade flow. Mild atheromatous irregularity at the cervical petrous junction without high-grade stenosis. Petrous, cavernous, and supraclinoid left ICA patent without stenosis. Left ophthalmic artery patent. Left ICA terminus widely patent. A1 segments and anterior communicating artery widely patent with good flow cross the circle-of-Willis. Anterior cerebral arteries well perfused bilaterally. Left M1 segment widely patent. No  proximal left M2 occlusion. Right M1 segment perfused without high-grade stenosis. Short-segment moderate right M2 stenosis without occlusion noted. Distal MCA branches fairly symmetric and well perfused bilaterally. Small vessel atheromatous irregularity noted. POSTERIOR CIRCULATION: Vertebral arteries code dominant and patent to the vertebrobasilar junction. Posterior inferior cerebral arteries not assessed on this exam. Basilar artery patent to its distal aspect without stenosis. Superior cerebral arteries patent bilaterally. Motion artifact limits evaluation of the basilar tip and proximal PCAs. Parent signal void at the right P1/P2 junction on reconstructed images favored to be motion related. PCAs well perfused to their distal aspects without stenosis. Bilateral posterior communicating arteries noted, right greater than left. Specifically, the right P com is patent, and likely contribute some flow to the right MCA territory as well. No aneurysm. IMPRESSION: MRI HEAD IMPRESSION 1. Three total punctate subcentimeter acute ischemic nonhemorrhagic infarcts involving the left basal ganglia, left periventricular white matter, and left parietal lobe as above. 2. Age-related cerebral atrophy with mild chronic small vessel ischemic disease. 3. Abnormal flow void within the right ICA, consistent with occlusion (see below). 4. Degenerative spondylolysis at C4-5 with resultant moderate spinal stenosis. MRA HEAD IMPRESSION 1. Occluded right ICA with distal reconstitution at the supraclinoid segment via flow across the circle-of-Willis and/or from the posterior circulation. Right anterior and middle cerebral arteries are well perfused. 2. No other hemodynamically significant or correctable stenosis within the intracranial circulation. 3. Distal small vessel atheromatous irregularity. Electronically Signed   By: Rise Mu M.D.   On: 06/20/2017 02:57     Echocardiogram 06/21/2017: Study Conclusions - Left  ventricle: The cavity size was normal. Wall thickness was increased in a pattern of mild LVH. Systolic function was normal. The estimated ejection fraction was in the range of 60% to 65%. Wall motion was normal; there were no regional wall motion abnormalities. Left ventricular diastolic function parameters were normal. - Atrial septum: No defect or patent foramen ovale was identified. - Pericardium, extracardiac: A trivial pericardial effusion was identified.  Subjective: Symptoms resolved. No new complaints. Adopted daughter's funeral is today at 2pm.  Discharge Exam: Vitals:   06/22/17 1219 06/22/17 1536  BP: 140/80 (!) 139/94  Pulse: 81 70  Resp: 20 20  Temp: 97.6 F (36.4 C) (!) 97.4 F (36.3 C)  SpO2:  100% 99%   General: Pt is alert, awake, not in acute distress Cardiovascular: RRR, S1/S2 +, no rubs, no gallops Respiratory: CTA bilaterally, no wheezing, no rhonchi Abdominal: Soft, NT, ND, bowel sounds + Neuro: Alert and oriented. very HOH otherwise CN's intact, No aphasia. No focal neurological deficits.  Labs: BNP (last 3 results) No results for input(s): BNP in the last 8760 hours. Basic Metabolic Panel: Recent Labs  Lab 06/19/17 1508 06/21/17 0736  NA 139 140  K 3.6 3.9  CL 104 108  CO2 25 24  GLUCOSE 95 97  BUN 19 17  CREATININE 1.41* 1.25*  CALCIUM 9.3 8.8*   Liver Function Tests: Recent Labs  Lab 06/19/17 2056  AST 30  ALT 15*  ALKPHOS 45  BILITOT 1.8*  PROT 7.5  ALBUMIN 3.9   CBC: Recent Labs  Lab 06/19/17 1508  WBC 6.6  HGB 14.3  HCT 40.9  MCV 86.8  PLT 152   Cardiac Enzymes: Recent Labs  Lab 06/19/17 2056  TROPONINI <0.03   BNP: Invalid input(s): POCBNP CBG: Recent Labs  Lab 06/19/17 2031  GLUCAP 90   D-Dimer No results for input(s): DDIMER in the last 72 hours. Hgb A1c No results for input(s): HGBA1C in the last 72 hours. Lipid Profile No results for input(s): CHOL, HDL, LDLCALC, TRIG, CHOLHDL,  LDLDIRECT in the last 72 hours. Thyroid function studies No results for input(s): TSH, T4TOTAL, T3FREE, THYROIDAB in the last 72 hours.  Invalid input(s): FREET3 Anemia work up No results for input(s): VITAMINB12, FOLATE, FERRITIN, TIBC, IRON, RETICCTPCT in the last 72 hours. Urinalysis    Component Value Date/Time   COLORURINE YELLOW 06/19/2017 2315   APPEARANCEUR CLEAR 06/19/2017 2315   LABSPEC 1.011 06/19/2017 2315   PHURINE 7.0 06/19/2017 2315   GLUCOSEU NEGATIVE 06/19/2017 2315   HGBUR NEGATIVE 06/19/2017 2315   BILIRUBINUR NEGATIVE 06/19/2017 2315   KETONESUR NEGATIVE 06/19/2017 2315   PROTEINUR NEGATIVE 06/19/2017 2315   NITRITE NEGATIVE 06/19/2017 2315   LEUKOCYTESUR NEGATIVE 06/19/2017 2315    Microbiology No results found for this or any previous visit (from the past 240 hour(s)).  Time coordinating discharge: Approximately 40 minutes  Hazeline Junker, MD  [facility] 06/23/2017, 4:10 PM Pager [phone]

## 2017-06-22 NOTE — Plan of Care (Signed)
  Problem: Clinical Measurements: Goal: Respiratory complications will improve Outcome: Progressing   Problem: Clinical Measurements: Goal: Diagnostic test results will improve Outcome: Progressing   Problem: Clinical Measurements: Goal: Ability to maintain clinical measurements within normal limits will improve Outcome: Progressing   Problem: Education: Goal: Knowledge of General Education information will improve Outcome: Progressing   Problem: Nutrition: Goal: Adequate nutrition will be maintained Outcome: Progressing   Problem: Elimination: Goal: Will not experience complications related to urinary retention Outcome: Progressing

## 2017-06-22 NOTE — Plan of Care (Signed)
Pt is able to participate in all ADL independently as per pt baseline

## 2017-06-23 ENCOUNTER — Telehealth: Payer: Self-pay | Admitting: Vascular Surgery

## 2017-06-23 NOTE — Telephone Encounter (Signed)
-----   Message from Sharee PimpleMarilyn K McChesney, RN sent at 06/22/2017  3:51 PM EDT ----- Regarding: 3-4 weeks no duplex   ----- Message ----- From: Chuck Hintickson, Christopher S, MD Sent: 06/22/2017   1:49 PM To: [facility] Charge Pool Subject: charge                                          I will need to see this patient in the office in 3-4 weeks.  He should not need a carotid duplex scan. Thanks CD

## 2017-06-23 NOTE — Telephone Encounter (Signed)
Spoke to spouse for appt 5/22  Mld lttr

## 2017-06-26 ENCOUNTER — Other Ambulatory Visit: Payer: Self-pay

## 2017-06-26 NOTE — Patient Outreach (Signed)
[facility] Care Management  06/26/2017  Christian BrazenJohn W [name] April 27, 1923 [id number]   EMMI: Stroke red alert Referral date: 06/26/17 Referral reason: Scheduled a follow up appointment; NO Day # 1 Attempt #1  Telephone call to patient regarding EMMI stroke red alert. Unable to reach patient. HIPAA compliant voice message left with call back phone number.   PLAN: RNCM will attempt 2nd telephone call to patient within 4 business days.  RNCM will send patient outreach letter to attempt contact.   Christian InaDavina Donnamae Muilenburg RN,BSN,CCM [facility] Telephonic  [phone]

## 2017-07-01 ENCOUNTER — Encounter (INDEPENDENT_AMBULATORY_CARE_PROVIDER_SITE_OTHER): Payer: Medicare Other | Admitting: Ophthalmology

## 2017-07-01 ENCOUNTER — Other Ambulatory Visit: Payer: Self-pay

## 2017-07-01 DIAGNOSIS — H35033 Hypertensive retinopathy, bilateral: Secondary | ICD-10-CM

## 2017-07-01 DIAGNOSIS — H43813 Vitreous degeneration, bilateral: Secondary | ICD-10-CM

## 2017-07-01 DIAGNOSIS — H353231 Exudative age-related macular degeneration, bilateral, with active choroidal neovascularization: Secondary | ICD-10-CM

## 2017-07-01 DIAGNOSIS — I10 Essential (primary) hypertension: Secondary | ICD-10-CM | POA: Diagnosis not present

## 2017-07-01 NOTE — Patient Outreach (Signed)
[facility]) Care Management  07/01/2017  Christian [name] 1923/10/28 [id number]  EMMI: Stroke red alert Referral date: 06/26/17 Referral reason: Scheduled a follow up appointment; NO Day # 1  Telephone call to patient regarding EMMI stroke red alert. Patient stated in back ground he is unable to hear on phone and request wife to talk for him.   Patients wife, Christian [name] states patient does not have his hearing aids.  HIPAA verified by patients wife for patient. Explained reason for call. Wife states patient had follow up with his primary MD on 06/29/17. Wife states patient has transportation to his appointments.  Wife states patient has and is taking his medications as prescribed.  Denies patient having any symptoms. Wife states patient reports he is feeling fine. Wife asked if patient is able to resume his normal activities.  RNCM advised wife to call his doctor to inquire about patients activity level.  Advised to call the neurologist  Office to ask. Wife verbalized understanding. Wife states she wants to know what patient can have to eat. States he is on a regular diet but she wants to know since she prepares the food.  RNCM reviewed patients discharge summary report with wife.  Informed wife patient is advised to be on a low sodium / heart healthy diet.  Offered to send wife/ patient EMMI education material on low salt heart healthy diet. Wife voiced appreciation.  Wife denied patient having any further needs.  RNCM discussed signs/ symptoms of stroke. Advised to call 911 for stroke like symptoms.  RNCM advised patient to notify MD of any changes in condition prior to scheduled appointment. RNCM verified patient aware of 911 services for urgent/ emergent needs.  PLAN; RNCM will close  Patient due to patient being assessed and having no further needs.  RNCM will send patient EMMI education material on low salt/ heart healthy diet.  George Ina RN,BSN,CCM [facility] Telephonic   [phone]

## 2017-07-08 ENCOUNTER — Other Ambulatory Visit: Payer: Self-pay

## 2017-07-08 NOTE — Patient Outreach (Signed)
[facility]) Care Management  07/08/2017  Christian [name] [birth year] [id number]   EMMI: stroke red alert Referral date: 07/08/17 Referral reason: went to follow up : no, Scheduled follow up: no Insurance [employer] care Day # 13  Telephone call to patient regarding EMMI stroke red alert. HIPAA verified.  Explained reason for call. Patient states he saw his primary MD approximately 1 week. Ago. Patient states he is not having any problems at this time.  Patient reports he has an appointment with the neurologist. On 07/22/17.  States he has transportation to his appointments. Patient states he continues to take his medications as prescribed.   RNCM reviewed stroke signs/ symptoms with patient. Advised to call 911 for these symptoms. Patient verbalized understanding. Denies any needs or concerns at this time.  Patient confirmed wife has contact phone number for [facility] care management and 24 hour nurse line.   PLAN: RNCM will close due to patient being assessed and having no further needs.   George Ina RN,BSN,CCM [facility] Telephonic  [phone]

## 2017-07-14 ENCOUNTER — Ambulatory Visit: Payer: Medicare Other | Admitting: Podiatry

## 2017-07-17 ENCOUNTER — Ambulatory Visit (INDEPENDENT_AMBULATORY_CARE_PROVIDER_SITE_OTHER): Payer: Medicare Other | Admitting: Podiatry

## 2017-07-17 ENCOUNTER — Encounter: Payer: Self-pay | Admitting: Podiatry

## 2017-07-17 DIAGNOSIS — M79676 Pain in unspecified toe(s): Secondary | ICD-10-CM

## 2017-07-17 DIAGNOSIS — B351 Tinea unguium: Secondary | ICD-10-CM | POA: Diagnosis not present

## 2017-07-17 DIAGNOSIS — E119 Type 2 diabetes mellitus without complications: Secondary | ICD-10-CM | POA: Diagnosis not present

## 2017-07-17 NOTE — Progress Notes (Signed)
Complaint:  Visit Type: Patient returns to my office for continued preventative foot care services. Complaint: Patient states" my nails have grown long and thick and become painful to walk and wear shoes" . The patient presents for preventative foot care services.   Podiatric Exam: Vascular: dorsalis pedis and posterior tibial pulses are palpable bilateral. Capillary return is immediate. Temperature gradient is WNL. Skin turgor WNL  Sensorium: Normal Semmes Weinstein monofilament test. Normal tactile sensation bilaterally. Nail Exam: Pt has thick disfigured discolored nails with subungual debris noted bilateral entire nail hallux through fifth toenails.    No redness or infection or drainage. Ulcer Exam: There is no evidence of ulcer or pre-ulcerative changes or infection. Orthopedic Exam: Muscle tone and strength are WNL. No limitations in general ROM. No crepitus or effusions noted. Foot type and digits show no abnormalities. Bony prominences are unremarkable. Floating second toe left foot. Skin: No Porokeratosis. No infection or ulcers  Diagnosis:  Onychomycosis, , Pain in right toe, pain in left toes  Treatment & Plan Procedures and Treatment: Consent by patient was obtained for treatment procedures. The patient understood the discussion of treatment and procedures well. All questions were answered thoroughly reviewed. Debridement of mycotic and hypertrophic toenails, 1 through 5 bilateral and clearing of subungual debris. No ulceration, no infection noted.  Return Visit-Office Procedure: Patient instructed to return to the office for a follow up visit 3 months for continued evaluation and treatment.    Alem Fahl DPM 

## 2017-07-22 ENCOUNTER — Encounter: Payer: Self-pay | Admitting: Vascular Surgery

## 2017-07-22 ENCOUNTER — Other Ambulatory Visit: Payer: Self-pay

## 2017-07-22 ENCOUNTER — Ambulatory Visit (INDEPENDENT_AMBULATORY_CARE_PROVIDER_SITE_OTHER): Payer: Medicare Other | Admitting: Vascular Surgery

## 2017-07-22 VITALS — BP 120/74 | HR 69 | Temp 97.3°F | Resp 16 | Ht 72.0 in | Wt 175.0 lb

## 2017-07-22 DIAGNOSIS — I6523 Occlusion and stenosis of bilateral carotid arteries: Secondary | ICD-10-CM

## 2017-07-22 MED ORDER — ATORVASTATIN CALCIUM 40 MG PO TABS: 40.0000 mg | ORAL_TABLET | Freq: Every day | ORAL | 0 refills | Status: DC

## 2017-07-22 NOTE — Progress Notes (Signed)
Patient name: Christian [name] MRN: [id number] DOB: [birth year] Sex: male  REASON FOR VISIT:   Follow-up of carotid disease.  HPI:   Christian [name] is a pleasant 84 y.o. male who I saw in consultation on 06/21/2017 in the hospital.  The patient had been admitted on 419 with a left brain stroke.  He had altered mental status and weakness.  However the wife describes some right arm and right leg weakness.  His work-up included a CT angiogram of the neck which showed occlusion of the right carotid artery.  There was a 60% stenosis of the distal left common carotid artery with a bulky calcific plaque.  MRI of the brain shows 3 punctate acute ischemic nonhemorrhagic infarcts in the left basal ganglia and and left periventricular white matter left parietal lobe.  I had Dr. Fabienne Bruns review the films to see if he was potentially a candidate for carotid stenting and he was not felt to be a good candidate.  Of note, I felt that the left carotid bifurcation was high.  Plavix was added during that admission for maximal medical therapy.  Given his age and the fact that the bifurcation was noted to be somewhat high felt that he would be at increased risk for carotid endarterectomy.  He was not a candidate for carotid stenting and therefore I recommended maximal medical therapy.  We would only consider carotid endarterectomy if he develops recurrent symptoms.  He comes in for a follow-up visit.  Since I saw him last, he denies any history of stroke, TIAs, expressive or receptive aphasia, or amaurosis fugax.  He remains on aspirin, Plavix, and a statin.  Current Outpatient Medications  Medication Sig Dispense Refill  . aspirin EC 81 MG tablet Take 81 mg by mouth at bedtime.    Marland Kitchen atorvastatin (LIPITOR) 40 MG tablet Take 1 tablet (40 mg total) by mouth daily at 6 PM. 30 tablet 0  . Calcium Carb-Cholecalciferol (CALCIUM 500+D3 PO) Take 1 tablet by mouth daily.     . Cholecalciferol (VITAMIN D3) 1000 units CAPS Take  1,000 Units by mouth at bedtime.    . clopidogrel (PLAVIX) 75 MG tablet Take 1 tablet (75 mg total) by mouth daily. 30 tablet 0  . hydrochlorothiazide (HYDRODIURIL) 25 MG tablet Take 25 mg by mouth daily.     Marland Kitchen levothyroxine (SYNTHROID, LEVOTHROID) 25 MCG tablet Take 25 mcg by mouth daily before breakfast.  3  . montelukast (SINGULAIR) 10 MG tablet Take 10 mg by mouth at bedtime.  11  . Probiotic Product (PROBIOTIC DAILY PO) Take 1 capsule by mouth daily.     . tamsulosin (FLOMAX) 0.4 MG CAPS capsule Take 0.4 mg by mouth at bedtime.   3   No current facility-administered medications for this visit.     REVIEW OF SYSTEMS:   denotes positive finding,  denotes negative finding Cardiac  Comments:  Chest pain or chest pressure:    Shortness of breath upon exertion:    Short of breath when lying flat:    Irregular heart rhythm:    Constitutional    Fever or chills:     PHYSICAL EXAM:   Vitals:   07/22/17 1429  BP: 120/74  Pulse: 69  Resp: 16  Temp: (!) 97.3 F (36.3 C)  TempSrc: Oral  SpO2: 98%  Weight: 175 lb (79.4 kg)  Height: 6' (1.829 m)    GENERAL: The patient is a well-nourished male, in no acute distress. The vital signs  are documented above. CARDIOVASCULAR: There is a regular rate and rhythm. PULMONARY: There is good air exchange bilaterally without wheezing or rales. VASCULAR: He has a soft left carotid bruit. He has palpable dorsalis pedis pulses bilaterally. NEURO: He has no focal weakness or paresthesias.   DATA:   No new data.  MEDICAL ISSUES:   BILATERAL CAROTID DISEASE: This patient has an occluded right internal carotid artery with a 40 to 59% left carotid stenosis.  The stenosis was potentially symptomatic.  Plavix was added during his last admission and has had no further symptoms.  He is not a candidate for carotid stenting.  He will be at increased risk for surgery given his age and also the fact that he has a high bifurcation.  Therefore will  continue with maximal medical therapy.  I have ordered a follow-up carotid duplex scan in 6 months and I will see him back at that time.  He knows to call sooner if he has problems.  Waverly Ferrari [facility] [phone]

## 2017-07-23 ENCOUNTER — Ambulatory Visit (INDEPENDENT_AMBULATORY_CARE_PROVIDER_SITE_OTHER): Payer: Medicare Other | Admitting: Adult Health

## 2017-07-23 ENCOUNTER — Encounter: Payer: Self-pay | Admitting: Adult Health

## 2017-07-23 VITALS — BP 128/81 | HR 71 | Ht 72.0 in | Wt 174.2 lb

## 2017-07-23 DIAGNOSIS — E785 Hyperlipidemia, unspecified: Secondary | ICD-10-CM | POA: Diagnosis not present

## 2017-07-23 DIAGNOSIS — I639 Cerebral infarction, unspecified: Secondary | ICD-10-CM

## 2017-07-23 DIAGNOSIS — I10 Essential (primary) hypertension: Secondary | ICD-10-CM

## 2017-07-23 NOTE — Patient Instructions (Addendum)
Continue aspirin 81 mg daily and clopidogrel 75 mg daily  and lipitor  for secondary stroke prevention  Continue to follow up with PCP regarding cholesterol and blood pressure management   Continue to monitor blood pressure at home  Continue to stay active and eat healthy  Maintain strict control of hypertension with blood pressure goal between 130-150, diabetes with hemoglobin A1c goal below 6.5% and cholesterol with LDL cholesterol (bad cholesterol) goal below 70 mg/dL. I also advised the patient to eat a healthy diet with plenty of whole grains, cereals, fruits and vegetables, exercise regularly and maintain ideal body weight.  Followup in the future with me in 3 months or call earlier if needed         Thank you for coming to see [place] at [facility]. I hope we have been able to provide you high quality care today.  You may receive a patient satisfaction survey over the next few weeks. We would appreciate your feedback and comments so that we may continue to improve ourselves and the health of our patients.

## 2017-07-23 NOTE — Progress Notes (Signed)
[facility] [address] [place]. [place] [address] [phone]       OFFICE FOLLOW UP NOTE  Christian [name] Date of Birth:  1923-05-31 Medical Record Number:  [id number]   Reason for Referral:  hospital stroke follow up  CHIEF COMPLAINT:  Chief Complaint  Patient presents with  . Follow-up    Hospital Stroke follow up, pt saw Dr. 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 History obtained from patient, wife and chart review. Reviewed all radiology images and labs personally.  Christian [name] is a 84 y.o. male with history of right bundle branch block, hypertension, diabetes,  chronic kidney disease, and BPH presenting with transient right-sided weakness and confusion.Marland Kitchen He did not receive IV t-PA due to late presentation.  CT head reviewed and showed no acute intracranial pathology.  MRA head reviewed and showed 3 total punctate subcentimeter acute ischemic nonhemorrhagic infarcts involving left basal ganglia, left periventricular white matter and left parietal lobe.  MRA head showed occluded right ICA.  CTA head and neck showed right ICA chronic occlusion, left ICA 60% with extensive atherosclerosis.  2D echo showed an EF of 60 to 65%.  Carotid ultrasound showed right ICA occluded and left ICA 40 to 59% stenosis with significant plaque at bifurcation.  These multiple left subcortical and periventricular punctate infarcts were likely from emboli from left ICA stenosis since the plaque.  LDL 57 and A1c 6.0.  Patient was on aspirin 81 mg CTA is recommended to be discharged on aspirin 325 along with Plavix 75.  As patient was not on statin PTA was recommended to be discharged on Lipitor 40.  Also recommended long-term BP goal 130-150 due to extracranial carotid occlusion/stenosis.  Patient was discharged home in stable condition. Patient did have office visit  with Dr. Edilia Bo on 07/22/2017.  Per his notes, Dr. Darrick Penna reviewed films to see if he is potentially a candidate provide stenting but he was cellulitis candidate.  It was also felt that given his age and the fact that the bifurcation was noted to be somewhat high it was felt that he would be increased risk for carotid enterectomy and therefore recommend maximal medical therapy.  It possibly be considered for carotid enterectomy if he develops recurrent symptoms.  Carotid duplex scan was ordered in 6 months along with follow-up appointment with Dr. Edilia Bo.  Since discharge, patient has been doing well and is accompanied by his wife.  Continues to take aspirin and Plavix without side effects of bleeding or bruising.  Continues to take Lipitor without side effects of myalgias.  Blood pressure today satisfactory at 128/81 and per wife this is checked at home and this is normal.  Patient denies residual deficits from stroke and is back to doing all his daily activities.  Continues to stay active and eat healthy.  Denies new or worsening stroke/TIA symptoms.   ROS:   14 system review of systems performed and negative with exception of hearing loss and loss of vision (due to macular degeneration)  PMH:  Past Medical History:  Diagnosis Date  . BPH (benign prostatic hyperplasia)   . Cataract   . CKD (chronic kidney disease)   . HTN (hypertension)   . Macular degeneration (senile) of retina   . RBBB with left anterior fascicular block   . Stroke [facility])  PSH: History reviewed. No pertinent surgical history.  Social History:  Social History   Socioeconomic History  . Marital status: Divorced    Spouse name: Not on file  . Number of children: Not on file  . Years of education: Not on file  . Highest education level: Not on file  Occupational History  . Not on file  Social Needs  . Financial resource strain: Not on file  . Food insecurity:    Worry: Not on file    Inability: Not on file  .  Transportation needs:    Medical: Not on file    Non-medical: Not on file  Tobacco Use  . Smoking status: Former Games developer  . Smokeless tobacco: Never Used  Substance and Sexual Activity  . Alcohol use: No    Alcohol/week: 0.0 oz  . Drug use: No  . Sexual activity: Not on file  Lifestyle  . Physical activity:    Days per week: Not on file    Minutes per session: Not on file  . Stress: Not on file  Relationships  . Social connections:    Talks on phone: Not on file    Gets together: Not on file    Attends religious service: Not on file    Active member of club or organization: Not on file    Attends meetings of clubs or organizations: Not on file    Relationship status: Not on file  . Intimate partner violence:    Fear of current or ex partner: Not on file    Emotionally abused: Not on file    Physically abused: Not on file    Forced sexual activity: Not on file  Other Topics Concern  . Not on file  Social History Narrative  . Not on file    Family History: History reviewed. No pertinent family history.  Medications:   Current Outpatient Medications on File Prior to Visit  Medication Sig Dispense Refill  . aspirin EC 81 MG tablet Take 81 mg by mouth at bedtime.    Marland Kitchen atorvastatin (LIPITOR) 40 MG tablet Take 1 tablet (40 mg total) by mouth daily at 6 PM. 30 tablet 0  . Calcium Carb-Cholecalciferol (CALCIUM 500+D3 PO) Take 1 tablet by mouth daily.     . Cholecalciferol (VITAMIN D3) 1000 units CAPS Take 1,000 Units by mouth at bedtime.    . clopidogrel (PLAVIX) 75 MG tablet Take 1 tablet (75 mg total) by mouth daily. 30 tablet 0  . hydrochlorothiazide (HYDRODIURIL) 25 MG tablet Take 25 mg by mouth daily.     Marland Kitchen levothyroxine (SYNTHROID, LEVOTHROID) 25 MCG tablet Take 25 mcg by mouth daily before breakfast.  3  . levothyroxine (SYNTHROID, LEVOTHROID) 25 MCG tablet TAKE 1 TABLET (25 MCG TOTAL) BY MOUTH DAILY AT 0600.    Marland Kitchen montelukast (SINGULAIR) 10 MG tablet Take 10 mg by mouth  at bedtime.  11  . Probiotic Product (PROBIOTIC DAILY PO) Take 1 capsule by mouth daily.     . tamsulosin (FLOMAX) 0.4 MG CAPS capsule Take 0.4 mg by mouth at bedtime.   3  . tamsulosin (FLOMAX) 0.4 MG CAPS capsule TAKE 1 CAPSULE BY MOUTH EVERY DAY     No current facility-administered medications on file prior to visit.     Allergies:   Allergies  Allergen Reactions  . Tetanus Toxoid, Adsorbed Other (See Comments)    Patient doesn't recall reaction  . Tetanus Toxoids Other (See Comments)    Patient doesn't recall reaction  Physical Exam  Vitals:   07/23/17 1039  BP: 128/81  Pulse: 71  Weight: 174 lb 3.2 oz (79 kg)  Height: 6' (1.829 m)   Body mass index is 23.63 kg/m. No exam data present  General: well developed, pleasant elderly African-American male, well nourished, seated, in no evident distress Head: head normocephalic and atraumatic.   Neck: supple with no carotid or supraclavicular bruits Cardiovascular: regular rate and rhythm, no murmurs Musculoskeletal: no deformity Skin:  no rash/petichiae Vascular:  Normal pulses all extremities  Neurologic Exam Mental Status: Awake and fully alert. Oriented to place and time. Recent and remote memory intact. Attention span, concentration and fund of knowledge appropriate. Mood and affect appropriate.  Cranial Nerves: Fundoscopic exam reveals sharp disc margins. Pupils equal, briskly reactive to light. Decreased vision due to macular degeneration . Hard of hearing. facial sensation intact. Face, tongue, palate moves normally and symmetrically.  Motor: Normal bulk and tone. Normal strength in all tested extremity muscles. Sensory.: intact to touch , pinprick , position and vibratory sensation.  Coordination: Rapid alternating movements normal in all extremities. Finger-to-nose and heel-to-shin performed accurately bilaterally. Gait and Station: Arises from chair without difficulty. Stance is normal. Gait demonstrates normal  stride length and balance with assistance of cane.   Reflexes: 1+ and symmetric. Toes downgoing.    NIHSS  0 Modified Rankin  0   Diagnostic Data (Labs, Imaging, Testing)  Ct Head Wo Contrast 06/19/2017 IMPRESSION:  1. No acute intracranial pathology seen on CT.  2. Moderate cortical volume loss and scattered small vessel ischemic microangiopathy.   CTA H&N 06/20/2017 IMPRESSION: 1. Right common carotid artery occlusion with distal reconstitution of the internal and external carotid arteries. 2. 60% distal left common carotid artery stenosis due to bulky calcified plaque. 3. Widely patent vertebral arteries. 4. Patent circle-of-Willis without proximal branch occlusion or flow limiting proximal stenosis. 5. Aortic Atherosclerosis (ICD10-I70.0) and Emphysema (ICD10-J43.9).  Christian [name] Head Wo Contrast 06/20/2017 IMPRESSION:   MRI HEAD  1. Three total punctate subcentimeter acute ischemic nonhemorrhagic infarcts involving the left basal ganglia, left periventricular white matter, and left parietal lobe as above.  2. Age-related cerebral atrophy with mild chronic small vessel ischemic disease.  3. Abnormal flow void within the right ICA, consistent with occlusion (see below).  4. Degenerative spondylolysis at C4-5 with resultant moderate spinal stenosis.   MRA HEAD  1. Occluded right ICA with distal reconstitution at the supraclinoid segment via flow across the circle-of-Willis and/or from the posterior circulation. Right anterior and middle cerebral arteries are well perfused.  2. No other hemodynamically significant or correctable stenosis within the intracranial circulation.  3. Distal small vessel atheromatous irregularity.   TTE - Left ventricle: The cavity size was normal. Wall thickness was increased in a pattern of mild LVH. Systolic function was normal. The estimated ejection fraction was in the range of 60% to 65%. Wall motion was normal; there were no regional  wall motion abnormalities. Left ventricular diastolic function parameters were normal. - Atrial septum: No defect or patent foramen ovale was identified. - Pericardium, extracardiac: A trivial pericardial effusion was identified.  CUS - Right ICA occlusion beginning at bifurcation. Patent right ECA. Left ICA 40-59% stenosis with significant plaque at bifurcation. Left ECA >50% stenosis.     ASSESSMENT: Christian [name] is a 84 y.o. year old male here with multiple left subcortical and periventricular punctate infarcts on 06/19/2017 secondary to likely an emboli from left ICA stenosis with extensive plaque. Vascular risk factors  include HTN, DM and HLD.    PLAN: -Continue aspirin 81 mg daily and clopidogrel 75 mg daily  and Lipitor for secondary stroke prevention -F/u with PCP regarding your HLD and HTN management -continue to monitor BP at home -Provided patient and wife is a variety of educational material in regards to stroke symptoms and risk factors. -f/u with vascular surgery for repeat scan in 6 months -Maintain strict control of hypertension with blood pressure goal below 130/90, diabetes with hemoglobin A1c goal below 6.5% and cholesterol with LDL cholesterol (bad cholesterol) goal below 70 mg/dL. I also advised the patient to eat a healthy diet with plenty of whole grains, cereals, fruits and vegetables, exercise regularly and maintain ideal body weight.  Follow up in 3 months or call earlier if needed  Greater than 50% of time during this 25 minute visit was spent on counseling,explanation of diagnosis of multiple infarcts, reviewing risk factor management of HLD, HTN and stenosis, planning of further management, discussion with patient and family and coordination of care  George Hugh, [facility]  [facility] [address] [place], [place] [address]  Phone [phone] Fax [phone]

## 2017-07-24 NOTE — Progress Notes (Signed)
I reviewed above note and agree with the assessment and plan.  Recommend to continue DAPT for 3 months and then plavix alone. Continue to follow up with [facility]VS and PCP and risk factor modification.   Marvel Plan, MD PhD Stroke Neurology 07/24/2017 12:28 PM

## 2017-07-29 ENCOUNTER — Telehealth: Payer: Self-pay

## 2017-07-29 NOTE — Telephone Encounter (Signed)
George Hugh, NP (Nurse Practitioner)         Added by: George Hugh, NP  Okay the patient will be notified to stop aspirin on 09/18/17 (3 months of DAPT therapy) and continue Plavix alone. Thank you. Layden Caterino, could you please notify patient to stop aspirin after 09/18/17 and continue Plavix alone. Continue to follow up with vascular surgery as well. Thank you.

## 2017-07-29 NOTE — Progress Notes (Signed)
Okay the patient will be notified to stop aspirin on 09/18/17 (3 months of DAPT therapy) and continue Plavix alone. Thank you. Katrina, could you please notify patient to stop aspirin after 09/18/17 and continue Plavix alone. Continue to follow up with vascular surgery as well. Thank you.

## 2017-07-30 NOTE — Telephone Encounter (Signed)
Rn call patients wife on dpr form. Rn stated per Shanda Bumps NP she was to make a medication change. Rn stated her husband needs to continue taking the aspirin,and plavix till 09/18/2017. After 09/18/2017 he should stop taking the aspirin, and just take plavix alone. The wife wrote down the instructions,and read it back to the nurse. The wife verbalized understanding.

## 2017-08-13 ENCOUNTER — Other Ambulatory Visit: Payer: Self-pay

## 2017-08-13 DIAGNOSIS — I6523 Occlusion and stenosis of bilateral carotid arteries: Secondary | ICD-10-CM

## 2017-08-18 ENCOUNTER — Other Ambulatory Visit: Payer: Self-pay | Admitting: Vascular Surgery

## 2017-09-17 ENCOUNTER — Other Ambulatory Visit: Payer: Self-pay | Admitting: Vascular Surgery

## 2017-09-21 ENCOUNTER — Other Ambulatory Visit: Payer: Self-pay

## 2017-09-21 DIAGNOSIS — I6523 Occlusion and stenosis of bilateral carotid arteries: Secondary | ICD-10-CM

## 2017-09-21 MED ORDER — ATORVASTATIN CALCIUM 40 MG PO TABS: 40.0000 mg | ORAL_TABLET | Freq: Every day | ORAL | 0 refills | Status: DC

## 2017-09-21 MED ORDER — CLOPIDOGREL BISULFATE 75 MG PO TABS: 75.0000 mg | ORAL_TABLET | Freq: Every day | ORAL | 0 refills | Status: DC

## 2017-09-23 ENCOUNTER — Encounter (INDEPENDENT_AMBULATORY_CARE_PROVIDER_SITE_OTHER): Payer: Medicare Other | Admitting: Ophthalmology

## 2017-09-23 DIAGNOSIS — I10 Essential (primary) hypertension: Secondary | ICD-10-CM

## 2017-09-23 DIAGNOSIS — H353231 Exudative age-related macular degeneration, bilateral, with active choroidal neovascularization: Secondary | ICD-10-CM | POA: Diagnosis not present

## 2017-09-23 DIAGNOSIS — H35033 Hypertensive retinopathy, bilateral: Secondary | ICD-10-CM | POA: Diagnosis not present

## 2017-09-23 DIAGNOSIS — H43813 Vitreous degeneration, bilateral: Secondary | ICD-10-CM | POA: Diagnosis not present

## 2017-10-16 ENCOUNTER — Ambulatory Visit: Payer: Medicare Other | Admitting: Podiatry

## 2017-10-23 ENCOUNTER — Encounter: Payer: Self-pay | Admitting: Adult Health

## 2017-10-23 ENCOUNTER — Ambulatory Visit (INDEPENDENT_AMBULATORY_CARE_PROVIDER_SITE_OTHER): Payer: Medicare Other | Admitting: Adult Health

## 2017-10-23 VITALS — BP 111/71 | HR 76 | Ht 72.0 in | Wt 172.0 lb

## 2017-10-23 DIAGNOSIS — I6523 Occlusion and stenosis of bilateral carotid arteries: Secondary | ICD-10-CM

## 2017-10-23 DIAGNOSIS — Z8673 Personal history of transient ischemic attack (TIA), and cerebral infarction without residual deficits: Secondary | ICD-10-CM

## 2017-10-23 DIAGNOSIS — E785 Hyperlipidemia, unspecified: Secondary | ICD-10-CM

## 2017-10-23 DIAGNOSIS — I10 Essential (primary) hypertension: Secondary | ICD-10-CM | POA: Diagnosis not present

## 2017-10-23 MED ORDER — CLOPIDOGREL BISULFATE 75 MG PO TABS: 75.0000 mg | ORAL_TABLET | Freq: Every day | ORAL | 4 refills | Status: DC

## 2017-10-23 NOTE — Patient Instructions (Signed)
Continue clopidogrel 75 mg daily  and lipitor  for secondary stroke prevention  Stop aspirin at this time per Dr. Warren DanesXu's reocmmendations  Continue to follow up with PCP regarding cholesterol and blood pressure management   Continue to monitor blood pressure at home  Maintain strict control of hypertension with blood pressure goal below 130/90, diabetes with hemoglobin A1c goal below 6.5% and cholesterol with LDL cholesterol (bad cholesterol) goal below 70 mg/dL. I also advised the patient to eat a healthy diet with plenty of whole grains, cereals, fruits and vegetables, exercise regularly and maintain ideal body weight.  Followup in the future with me in 6 months or call earlier if needed       [facility]. I hope we have been able to provide you high quality care today.  You may receive a patient satisfaction survey over the next few weeks. We would appreciate your feedback and comments so that we may continue to improve ourselves and the health of our patients.

## 2017-10-23 NOTE — Progress Notes (Signed)
[facility] [address] [place]. [place] [address] [phone]       OFFICE FOLLOW UP NOTE  Christian [name] Date of Birth:  November 03, 1923 Medical Record Number:  [id number]   Reason for Referral:  hospital stroke follow up  CHIEF COMPLAINT:  Chief Complaint  Patient presents with  . Follow-up    pt with wife. pt states that things are going well. pt voices no concerns. the wife states that his left leg can be slightly more swelling then right.     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 History obtained from patient, wife and chart review. Reviewed all radiology images and labs personally.  [name] is a 84 y.o. male with history of right bundle branch block, hypertension, diabetes,  chronic kidney disease, and BPH presenting with transient right-sided weakness and confusion.Marland Kitchen He did not receive IV t-PA due to late presentation.  CT head reviewed and showed no acute intracranial pathology.  MRI head reviewed and showed 3 total punctate subcentimeter acute ischemic nonhemorrhagic infarcts involving left basal ganglia, left periventricular white matter and left parietal lobe.  MRA head showed occluded right ICA.  CTA head and neck showed right ICA chronic occlusion, left ICA 60% with extensive atherosclerosis.  2D echo showed an EF of 60 to 65%.  Carotid ultrasound showed right ICA occluded and left ICA 40 to 59% stenosis with significant plaque at bifurcation.  These multiple left subcortical and periventricular punctate infarcts were likely from emboli from left ICA stenosis since the plaque.  LDL 57 and A1c 6.0.  Patient was on aspirin 81 mg PTA and is recommended to be discharged on aspirin 325 along with Plavix 75.  As patient was not on statin PTA was recommended to be discharged on Lipitor 40.  Also recommended long-term BP goal 130-150 due to extracranial carotid occlusion/stenosis.  Patient was  discharged home in stable condition. Patient did have office visit with Dr. Edilia [name] on 07/22/2017.  Per his notes, Dr. Darrick [name] reviewed films to see if he is potentially a candidate for carotid stenting but it was determined he is not appropriate candidate.  It was also felt that given his age and the fact that the bifurcation was noted to be somewhat high it was felt that he would be increased risk for carotid enterectomy and therefore recommend maximal medical therapy.  It possibly be considered for carotid enterectomy if he develops recurrent symptoms.  Carotid duplex scan was ordered in 6 months along with follow-up appointment with Dr. Edilia [name].   07/23/2017 visit: Since discharge, patient has been doing well and is accompanied by his wife.  Continues to take aspirin and Plavix without side effects of bleeding or bruising.  Continues to take Lipitor without side effects of myalgias.  Blood pressure today satisfactory at 128/81 and per wife this is checked at home and this is normal.  Patient denies residual deficits from stroke and is back to doing all his daily activities.  Continues to stay active and eat healthy.  Denies new or worsening stroke/TIA symptoms.  Interval history 10/23/2017: Patient is being seen today for scheduled follow-up visit.  Overall he continues to do well from a stroke standpoint without residual deficits.  After previous appointment, Dr. Roda [name] recommended DAPT for 3 months and then Plavix alone.  Per telephone notes, wife was advised of this but at today's appointment, patient continues to take aspirin 81  mg along with Plavix daily.  He denies bleeding or bruising.  Continues to take Lipitor without side effects myalgias.  Blood pressure today satisfactory 111/71.  Patient continues to stay active with doing outside work such as mowing his lawn and riding his tractors and denies any complications from this. Vascular surgery recommended repeat carotid duplex in November (49-month follow-up  scan).  Denies new or worsening stroke/TIA symptoms.  ROS:   14 system review of systems performed and negative with exception of hearing loss and leg swelling  PMH:  Past Medical History:  Diagnosis Date  . BPH (benign prostatic hyperplasia)   . Cataract   . CKD (chronic kidney disease)   . HTN (hypertension)   . Macular degeneration (senile) of retina   . RBBB with left anterior fascicular block   . Stroke [facility])     PSH: No past surgical history on file.  Social History:  Social History   Socioeconomic History  . Marital status: Divorced    Spouse name: Not on file  . Number of children: Not on file  . Years of education: Not on file  . Highest education level: Not on file  Occupational History  . Not on file  Social Needs  . Financial resource strain: Not on file  . Food insecurity:    Worry: Not on file    Inability: Not on file  . Transportation needs:    Medical: Not on file    Non-medical: Not on file  Tobacco Use  . Smoking status: Former Games developer  . Smokeless tobacco: Never Used  Substance and Sexual Activity  . Alcohol use: No    Alcohol/week: 0.0 standard drinks  . Drug use: No  . Sexual activity: Not on file  Lifestyle  . Physical activity:    Days per week: Not on file    Minutes per session: Not on file  . Stress: Not on file  Relationships  . Social connections:    Talks on phone: Not on file    Gets together: Not on file    Attends religious service: Not on file    Active member of club or organization: Not on file    Attends meetings of clubs or organizations: Not on file    Relationship status: Not on file  . Intimate partner violence:    Fear of current or ex partner: Not on file    Emotionally abused: Not on file    Physically abused: Not on file    Forced sexual activity: Not on file  Other Topics Concern  . Not on file  Social History Narrative  . Not on file    Family History: No family history on file.  Medications:     Current Outpatient Medications on File Prior to Visit  Medication Sig Dispense Refill  . aspirin EC 81 MG tablet Take 81 mg by mouth at bedtime.    Marland Kitchen atorvastatin (LIPITOR) 40 MG tablet TAKE 1 TABLET (40 MG TOTAL) BY MOUTH DAILY AT 6 PM. 30 tablet 0  . Calcium Carb-Cholecalciferol (CALCIUM 500+D3 PO) Take 1 tablet by mouth daily.     . Cholecalciferol (VITAMIN D3) 1000 units CAPS Take 1,000 Units by mouth at bedtime.    . clopidogrel (PLAVIX) 75 MG tablet Take 1 tablet (75 mg total) by mouth daily. 30 tablet 0  . hydrochlorothiazide (HYDRODIURIL) 25 MG tablet Take 25 mg by mouth daily.     Marland Kitchen levothyroxine (SYNTHROID, LEVOTHROID) 25 MCG tablet TAKE 1 TABLET (  25 MCG TOTAL) BY MOUTH DAILY AT 0600.    Marland Kitchen. montelukast (SINGULAIR) 10 MG tablet Take 10 mg by mouth at bedtime.  11  . Probiotic Product (PROBIOTIC DAILY PO) Take 1 capsule by mouth daily.     . tamsulosin (FLOMAX) 0.4 MG CAPS capsule TAKE 1 CAPSULE BY MOUTH EVERY DAY     No current facility-administered medications on file prior to visit.     Allergies:   Allergies  Allergen Reactions  . Tetanus Toxoid, Adsorbed Other (See Comments)    Patient doesn't recall reaction  . Tetanus Toxoids Other (See Comments)    Patient doesn't recall reaction     Physical Exam  Vitals:   10/23/17 1042  BP: 111/71  Pulse: 76  Weight: 172 lb (78 kg)  Height: 6' (1.829 m)   Body mass index is 23.33 kg/m. No exam data present  General: well developed, pleasant elderly African-American male, well nourished, seated, in no evident distress Head: head normocephalic and atraumatic.   Neck: supple with no carotid or supraclavicular bruits Cardiovascular: regular rate and rhythm, no murmurs Musculoskeletal: no deformity Skin:  no rash/petichiae Vascular:  Normal pulses all extremities  Neurologic Exam Mental Status: Awake and fully alert. Oriented to place and time. Recent and remote memory intact. Attention span, concentration and fund of  knowledge appropriate. Mood and affect appropriate.  Cranial Nerves: Fundoscopic exam reveals sharp disc margins. Pupils equal, briskly reactive to light. Decreased vision due to macular degeneration . Hard of hearing. facial sensation intact. Face, tongue, palate moves normally and symmetrically.  Motor: Normal bulk and tone. Normal strength in all tested extremity muscles. Sensory.: intact to touch , pinprick , position and vibratory sensation.  Coordination: Rapid alternating movements normal in all extremities. Finger-to-nose and heel-to-shin performed accurately bilaterally. Gait and Station: Arises from chair without difficulty. Stance is normal. Gait demonstrates normal stride length and balance with assistance of cane.   Reflexes: 1+ and symmetric. Toes downgoing.      ASSESSMENT: Christian [name] is a 84 y.o. year old male here with multiple left subcortical and periventricular punctate infarcts on 06/19/2017 secondary to likely an emboli from left ICA stenosis with extensive plaque. Vascular risk factors include HTN, DM and HLD.  Patient is being seen today for scheduled follow-up visit and overall continues to do well from a stroke standpoint.   PLAN: -Continue clopidogrel 75 mg daily  and Lipitor for secondary stroke prevention -Advised to stop aspirin at this time as DAPT with aspirin and Plavix not indicated for greater than 3 months -F/u with PCP regarding your HLD and HTN management -continue to monitor BP at home -repeat CUS 01/06/18 for monitoring of left ICA stenosis per vascular surgery -Advised to continue to stay active as tolerated and maintain a healthy diet -Maintain strict control of hypertension with blood pressure goal below 130/90, diabetes with hemoglobin A1c goal below 6.5% and cholesterol with LDL cholesterol (bad cholesterol) goal below 70 mg/dL. I also advised the patient to eat a healthy diet with plenty of whole grains, cereals, fruits and vegetables, exercise  regularly and maintain ideal body weight.  Follow up in 6 months or call earlier if needed  Greater than 50% of time during this 25 minute visit was spent on counseling,explanation of diagnosis of multiple infarcts, reviewing risk factor management of HLD, HTN and stenosis, planning of further management, discussion with patient and family and coordination of care  George HughJessica VanSchaick, AGNP-BC  [facility] [address] [place], [place] [address]  Phone [phone] Fax [phone]

## 2017-10-30 NOTE — Progress Notes (Signed)
I agree with the above plan 

## 2017-12-02 ENCOUNTER — Encounter: Payer: Self-pay | Admitting: Podiatry

## 2017-12-02 ENCOUNTER — Ambulatory Visit (INDEPENDENT_AMBULATORY_CARE_PROVIDER_SITE_OTHER): Payer: Medicare Other | Admitting: Podiatry

## 2017-12-02 ENCOUNTER — Encounter

## 2017-12-02 DIAGNOSIS — B351 Tinea unguium: Secondary | ICD-10-CM

## 2017-12-02 DIAGNOSIS — E119 Type 2 diabetes mellitus without complications: Secondary | ICD-10-CM | POA: Diagnosis not present

## 2017-12-02 DIAGNOSIS — M79676 Pain in unspecified toe(s): Secondary | ICD-10-CM

## 2017-12-02 NOTE — Progress Notes (Signed)
Complaint:  Visit Type: Patient returns to my office for continued preventative foot care services. Complaint: Patient states" my nails have grown long and thick and become painful to walk and wear shoes" . The patient presents for preventative foot care services.   Podiatric Exam: Vascular: dorsalis pedis and posterior tibial pulses are palpable bilateral. Capillary return is immediate. Temperature gradient is WNL. Skin turgor WNL  Sensorium: Normal Semmes Weinstein monofilament test. Normal tactile sensation bilaterally. Nail Exam: Pt has thick disfigured discolored nails with subungual debris noted bilateral entire nail hallux through fifth toenails.    No redness or infection or drainage. Ulcer Exam: There is no evidence of ulcer or pre-ulcerative changes or infection. Orthopedic Exam: Muscle tone and strength are WNL. No limitations in general ROM. No crepitus or effusions noted. Foot type and digits show no abnormalities. Bony prominences are unremarkable. Floating second toe left foot. Skin: No Porokeratosis. No infection or ulcers  Diagnosis:  Onychomycosis, , Pain in right toe, pain in left toes  Treatment & Plan Procedures and Treatment: Consent by patient was obtained for treatment procedures. The patient understood the discussion of treatment and procedures well. All questions were answered thoroughly reviewed. Debridement of mycotic and hypertrophic toenails, 1 through 5 bilateral and clearing of subungual debris. No ulceration, no infection noted.  Return Visit-Office Procedure: Patient instructed to return to the office for a follow up visit 3 months for continued evaluation and treatment.    Kenyonna Micek DPM 

## 2017-12-16 ENCOUNTER — Encounter (INDEPENDENT_AMBULATORY_CARE_PROVIDER_SITE_OTHER): Payer: Medicare Other | Admitting: Ophthalmology

## 2017-12-16 DIAGNOSIS — H35033 Hypertensive retinopathy, bilateral: Secondary | ICD-10-CM | POA: Diagnosis not present

## 2017-12-16 DIAGNOSIS — I10 Essential (primary) hypertension: Secondary | ICD-10-CM | POA: Diagnosis not present

## 2017-12-16 DIAGNOSIS — H43813 Vitreous degeneration, bilateral: Secondary | ICD-10-CM

## 2017-12-16 DIAGNOSIS — H353231 Exudative age-related macular degeneration, bilateral, with active choroidal neovascularization: Secondary | ICD-10-CM | POA: Diagnosis not present

## 2018-01-06 ENCOUNTER — Ambulatory Visit (INDEPENDENT_AMBULATORY_CARE_PROVIDER_SITE_OTHER): Payer: Medicare Other | Admitting: Vascular Surgery

## 2018-01-06 ENCOUNTER — Other Ambulatory Visit: Payer: Self-pay

## 2018-01-06 ENCOUNTER — Ambulatory Visit (HOSPITAL_COMMUNITY)
Admission: RE | Admit: 2018-01-06 | Discharge: 2018-01-06 | Disposition: A | Discharge status: Home / Self Care | Payer: Medicare Other | Source: Ambulatory Visit | Attending: Vascular Surgery | Admitting: Vascular Surgery

## 2018-01-06 ENCOUNTER — Encounter: Payer: Self-pay | Admitting: Vascular Surgery

## 2018-01-06 VITALS — BP 128/75 | HR 65 | Temp 97.0°F | Resp 22 | Ht 72.0 in | Wt 175.0 lb

## 2018-01-06 DIAGNOSIS — I6523 Occlusion and stenosis of bilateral carotid arteries: Secondary | ICD-10-CM

## 2018-01-06 NOTE — Progress Notes (Signed)
Patient name: Christian [name] MRN: [id number] DOB: June 17, 1923 Sex: male  REASON FOR VISIT:   Follow-up of carotid disease.  HPI:   Christian [name] is a pleasant 84 y.o. male White seen in consultation on 06/21/2017 in the hospital after he had been admitted with a left brain stroke.  He had right arm and right leg weakness.  I reviewed the films with Dr. Fabienne Bruns and it was not felt to be a good candidate for carotid stenting.  I felt that the bifurcation on the left was high also and therefore this would carotid endarterectomy.  I recommended maximal medical therapy.  When I saw him last he had a 40 to 59% left carotid stenosis.  He had no recent symptoms after Plavix had been added to his medical regimen.  He comes in for a six-month follow-up visit.  Since I saw him last, he denies any history of stroke, TIAs, expressive or receptive aphasia, or amaurosis fugax.  He is not a smoker.  He is on Plavix and is on a statin.  Past Medical History:  Diagnosis Date  . BPH (benign prostatic hyperplasia)   . Cataract   . CKD (chronic kidney disease)   . HTN (hypertension)   . Macular degeneration (senile) of retina   . RBBB with left anterior fascicular block   . Stroke [facility])     History reviewed. No pertinent family history.  SOCIAL HISTORY: Social History   Tobacco Use  . Smoking status: Former Games developer  . Smokeless tobacco: Never Used  Substance Use Topics  . Alcohol use: No    Alcohol/week: 0.0 standard drinks    Allergies  Allergen Reactions  . Tetanus Toxoid, Adsorbed Other (See Comments)    Patient doesn't recall reaction  . Tetanus Toxoids Other (See Comments)    Patient doesn't recall reaction    Current Outpatient Medications  Medication Sig Dispense Refill  . atorvastatin (LIPITOR) 40 MG tablet TAKE 1 TABLET (40 MG TOTAL) BY MOUTH DAILY AT 6 PM. 30 tablet 0  . Calcium Carb-Cholecalciferol (CALCIUM 500+D3 PO) Take 1 tablet by mouth daily.     . Cholecalciferol  (VITAMIN D3) 1000 units CAPS Take 1,000 Units by mouth at bedtime.    . clopidogrel (PLAVIX) 75 MG tablet Take 1 tablet (75 mg total) by mouth daily. 90 tablet 4  . Dextromethorphan HBr (VICKS DAYQUIL COUGH) 15 MG/15ML LIQD Take by mouth.    . hydrochlorothiazide (HYDRODIURIL) 25 MG tablet Take 25 mg by mouth daily.     Marland Kitchen levothyroxine (SYNTHROID, LEVOTHROID) 25 MCG tablet TAKE 1 TABLET (25 MCG TOTAL) BY MOUTH DAILY AT 0600.    Marland Kitchen loratadine (CLARITIN) 10 MG tablet Take 10 mg by mouth daily.  11  . montelukast (SINGULAIR) 10 MG tablet Take 10 mg by mouth at bedtime.  11  . Probiotic Product (PROBIOTIC DAILY PO) Take 1 capsule by mouth daily.     . tamsulosin (FLOMAX) 0.4 MG CAPS capsule TAKE 1 CAPSULE BY MOUTH EVERY DAY     No current facility-administered medications for this visit.     REVIEW OF SYSTEMS:  [X]  denotes positive finding, [ ]  denotes negative finding Cardiac  Comments:  Chest pain or chest pressure:    Shortness of breath upon exertion:    Short of breath when lying flat:    Irregular heart rhythm:        Vascular    Pain in calf, thigh, or hip brought on by ambulation:  Pain in feet at night that wakes you up from your sleep:     Blood clot in your veins:    Leg swelling:         Pulmonary    Oxygen at home:    Productive cough:     Wheezing:         Neurologic    Sudden weakness in arms or legs:     Sudden numbness in arms or legs:     Sudden onset of difficulty speaking or slurred speech:    Temporary loss of vision in one eye:     Problems with dizziness:         Gastrointestinal    Blood in stool:     Vomited blood:         Genitourinary    Burning when urinating:     Blood in urine:        Psychiatric    Major depression:         Hematologic    Bleeding problems:    Problems with blood clotting too easily:        Skin    Rashes or ulcers:        Constitutional    Fever or chills:     PHYSICAL EXAM:   Vitals:   01/06/18 1349 01/06/18  1350  BP: 127/75 128/75  Pulse: 65   Resp: (!) 22   Temp: (!) 97 F (36.1 C)   TempSrc: Oral   SpO2: 99%   Weight: 175 lb (79.4 kg)   Height: 6' (1.829 m)     GENERAL: The patient is a well-nourished male, in no acute distress. The vital signs are documented above. CARDIAC: There is a regular rate and rhythm.  VASCULAR: He has a left carotid bruit. PULMONARY: There is good air exchange bilaterally without wheezing or rales. ABDOMEN: Soft and non-tender with normal pitched bowel sounds.  MUSCULOSKELETAL: There are no major deformities or cyanosis. NEUROLOGIC: No focal weakness or paresthesias are detected. SKIN: There are no ulcers or rashes noted. PSYCHIATRIC: The patient has a normal affect.  DATA:    CAROTID DUPLEX: I have independently interpreted his carotid duplex scan.  He has a known right internal carotid artery occlusion.  On the left side he has a 40 to 59% stenosis.  The velocities on the left and actually improved compared to his previous study.  Both vertebral arteries are patent with antegrade flow.  MEDICAL ISSUES:   BILATERAL CAROTID DISEASE: This patient has a known right internal carotid artery occlusion.  He had a left brain stroke associated with a moderate left carotid stenosis but has a high bifurcation and was not felt to be a good candidate for carotid endarterectomy.  In addition his films were reviewed by Dr. Fabienne Bruns and it was not felt to be a good candidate for carotid stenting.  We have been treating him with maximal medical therapy and he is remained asymptomatic since his original symptoms.  The stenosis on the left is stable and is actually improved compared to his previous study.  I recommended follow-up carotid duplex scan in 1 year and I will see him back at that time.  He knows to call sooner if he has problems.  In the meantime he will remain on Plavix and a statin.  Waverly Ferrari [facility]  [phone]

## 2018-03-10 ENCOUNTER — Ambulatory Visit: Payer: Medicare Other | Admitting: Podiatry

## 2018-03-10 ENCOUNTER — Encounter (INDEPENDENT_AMBULATORY_CARE_PROVIDER_SITE_OTHER): Payer: Medicare Other | Admitting: Ophthalmology

## 2018-03-10 DIAGNOSIS — H353231 Exudative age-related macular degeneration, bilateral, with active choroidal neovascularization: Secondary | ICD-10-CM

## 2018-03-10 DIAGNOSIS — H43813 Vitreous degeneration, bilateral: Secondary | ICD-10-CM | POA: Diagnosis not present

## 2018-03-10 DIAGNOSIS — I10 Essential (primary) hypertension: Secondary | ICD-10-CM | POA: Diagnosis not present

## 2018-03-10 DIAGNOSIS — H35033 Hypertensive retinopathy, bilateral: Secondary | ICD-10-CM

## 2018-04-13 ENCOUNTER — Encounter: Payer: Self-pay | Admitting: Podiatry

## 2018-04-13 ENCOUNTER — Encounter

## 2018-04-13 ENCOUNTER — Ambulatory Visit (INDEPENDENT_AMBULATORY_CARE_PROVIDER_SITE_OTHER): Payer: Medicare Other | Admitting: Podiatry

## 2018-04-13 DIAGNOSIS — B351 Tinea unguium: Secondary | ICD-10-CM

## 2018-04-13 DIAGNOSIS — E119 Type 2 diabetes mellitus without complications: Secondary | ICD-10-CM | POA: Diagnosis not present

## 2018-04-13 DIAGNOSIS — M79676 Pain in unspecified toe(s): Secondary | ICD-10-CM

## 2018-04-13 NOTE — Progress Notes (Signed)
Complaint:  Visit Type: Patient returns to my office for continued preventative foot care services. Complaint: Patient states" my nails have grown long and thick and become painful to walk and wear shoes" . The patient presents for preventative foot care services.   Podiatric Exam: Vascular: dorsalis pedis and posterior tibial pulses are palpable bilateral. Capillary return is immediate. Temperature gradient is WNL. Skin turgor WNL  Sensorium: Normal Semmes Weinstein monofilament test. Normal tactile sensation bilaterally. Nail Exam: Pt has thick disfigured discolored nails with subungual debris noted bilateral entire nail hallux through fifth toenails.    No redness or infection or drainage. Ulcer Exam: There is no evidence of ulcer or pre-ulcerative changes or infection. Orthopedic Exam: Muscle tone and strength are WNL. No limitations in general ROM. No crepitus or effusions noted. Foot type and digits show no abnormalities. Bony prominences are unremarkable. Floating second toe left foot. Skin: No Porokeratosis. No infection or ulcers  Diagnosis:  Onychomycosis, , Pain in right toe, pain in left toes  Treatment & Plan Procedures and Treatment: Consent by patient was obtained for treatment procedures. The patient understood the discussion of treatment and procedures well. All questions were answered thoroughly reviewed. Debridement of mycotic and hypertrophic toenails, 1 through 5 bilateral and clearing of subungual debris. No ulceration, no infection noted.  Return Visit-Office Procedure: Patient instructed to return to the office for a follow up visit 3 months for continued evaluation and treatment.    Helane Gunther DPM

## 2018-04-26 ENCOUNTER — Encounter: Payer: Self-pay | Admitting: Adult Health

## 2018-04-26 ENCOUNTER — Ambulatory Visit (INDEPENDENT_AMBULATORY_CARE_PROVIDER_SITE_OTHER): Payer: Medicare Other | Admitting: Adult Health

## 2018-04-26 VITALS — BP 125/70 | HR 81 | Ht 72.0 in | Wt 181.0 lb

## 2018-04-26 DIAGNOSIS — I10 Essential (primary) hypertension: Secondary | ICD-10-CM

## 2018-04-26 DIAGNOSIS — I6523 Occlusion and stenosis of bilateral carotid arteries: Secondary | ICD-10-CM | POA: Diagnosis not present

## 2018-04-26 DIAGNOSIS — E785 Hyperlipidemia, unspecified: Secondary | ICD-10-CM

## 2018-04-26 DIAGNOSIS — Z8673 Personal history of transient ischemic attack (TIA), and cerebral infarction without residual deficits: Secondary | ICD-10-CM

## 2018-04-26 NOTE — Patient Instructions (Signed)
Continue clopidogrel 75 mg daily  and Lipitor for secondary stroke prevention  Continue to follow up with PCP regarding cholesterol and blood pressure management   Follow-up with vascular surgery as recommended for routine monitoring  Continue to monitor blood pressure at home  Maintain strict control of hypertension with blood pressure goal below 130/90, diabetes with hemoglobin A1c goal below 6.5% and cholesterol with LDL cholesterol (bad cholesterol) goal below 70 mg/dL. I also advised the patient to eat a healthy diet with plenty of whole grains, cereals, fruits and vegetables, exercise regularly and maintain ideal body weight.         Thank you for coming to see [place] at [facility]. I hope we have been able to provide you high quality care today.  You may receive a patient satisfaction survey over the next few weeks. We would appreciate your feedback and comments so that we may continue to improve ourselves and the health of our patients.

## 2018-04-26 NOTE — Progress Notes (Signed)
I agree with the above plan 

## 2018-04-26 NOTE — Progress Notes (Signed)
[facility] [address] [place] [address] [phone]       OFFICE FOLLOW UP NOTE  [name] Date of Birth:  11/18/1923 Medical Record Number:  [id number]   Reason for Referral:  hospital stroke follow up  CHIEF COMPLAINT:  Chief Complaint  Patient presents with  . Follow-up    6 month follow up  Stroke room 9 pt with wife Christian [name]    HPI: 04/26/18 VISIT Christian [name] is a 85 year old male who is being seen today for follow-up of stroke in 06/2017 and is accompanied by his wife.  Overall, he continues to do well from a stroke standpoint without residual deficits or new/recurring stroke/TIA symptoms.  He continues on Plavix without side effects of bleeding or bruising along with atorvastatin without side effects myalgias.  Blood pressure today satisfactory at 125/70.  He was evaluated by vascular surgery on 01/06/2018 with report of left carotid stenosis stable at 40 to 59% and right ICA showing complete occlusion (stable from prior imaging) and recommended follow-up in 1 years time.  Denies new or worsening stroke/TIA symptoms.     HISTORY 10/23/2017: Patient is being seen today for scheduled follow-up visit.  Overall he continues to do well from a stroke standpoint without residual deficits.  After previous appointment, Dr. Roda Shutters recommended DAPT for 3 months and then Plavix alone.  Per telephone notes, wife was advised of this but at today's appointment, patient continues to take aspirin 81 mg along with Plavix daily.  He denies bleeding or bruising.  Continues to take Lipitor without side effects myalgias.  Blood pressure today satisfactory 111/71.  Patient continues to stay active with doing outside work such as mowing his lawn and riding his tractors and denies any complications from this. Vascular surgery recommended repeat carotid duplex in November (61-month follow-up scan).  Denies new or worsening stroke/TIA symptoms.  INITIAL VISIT 07/23/2017: Christian [name] is being seen today in the office for follow-up of multiple infarct stroke on 06/19/17. History obtained from patient, wife and chart review. Reviewed all radiology images and labs personally.  Christian [name] is a 85 y.o. male with history of right bundle branch block, hypertension, diabetes,  chronic kidney disease, and BPH presenting with transient right-sided weakness and confusion.Marland Kitchen He did not receive IV t-PA due to late presentation.  CT head reviewed and showed no acute intracranial pathology.  MRI head reviewed and showed 3 total punctate subcentimeter acute ischemic nonhemorrhagic infarcts involving left basal ganglia, left periventricular white matter and left parietal lobe.  MRA head showed occluded right ICA.  CTA head and neck showed right ICA chronic occlusion, left ICA 60% with extensive atherosclerosis.  2D echo showed an EF of 60 to 65%.  Carotid ultrasound showed right ICA occluded and left ICA 40 to 59% stenosis with significant plaque at bifurcation.  These multiple left subcortical and periventricular punctate infarcts were likely from emboli from left ICA stenosis since the plaque.  LDL 57 and A1c 6.0.  Patient was on aspirin 81 mg PTA and is recommended to be discharged on aspirin 325 along with Plavix 75.  As patient was not on statin PTA was recommended to be discharged on Lipitor 40.  Also recommended long-term BP goal 130-150 due to extracranial carotid occlusion/stenosis.  Patient was discharged home in stable condition. Patient did have office visit with Dr. Edilia Bo on 07/22/2017.  Per his notes, Dr. Darrick Penna reviewed films to see if he is potentially a candidate  for carotid stenting but it was determined he is not appropriate candidate.  It was also felt that given his age and the fact that the bifurcation was noted to be somewhat high it was felt that he would be increased risk for carotid enterectomy and therefore recommend maximal medical therapy.  It possibly be considered  for carotid enterectomy if he develops recurrent symptoms.  Carotid duplex scan was ordered in 6 months along with follow-up appointment with Dr. Edilia Bo.  Since discharge, patient has been doing well and is accompanied by his wife.  Continues to take aspirin and Plavix without side effects of bleeding or bruising.  Continues to take Lipitor without side effects of myalgias.  Blood pressure today satisfactory at 128/81 and per wife this is checked at home and this is normal.  Patient denies residual deficits from stroke and is back to doing all his daily activities.  Continues to stay active and eat healthy.  Denies new or worsening stroke/TIA symptoms.    ROS:   14 system review of systems performed and negative with exception of hearing loss  PMH:  Past Medical History:  Diagnosis Date  . BPH (benign prostatic hyperplasia)   . Cataract   . CKD (chronic kidney disease)   . HTN (hypertension)   . Macular degeneration (senile) of retina   . RBBB with left anterior fascicular block   . Stroke [facility])     PSH: History reviewed. No pertinent surgical history.  Social History:  Social History   Socioeconomic History  . Marital status: Divorced    Spouse name: Not on file  . Number of children: Not on file  . Years of education: Not on file  . Highest education level: Not on file  Occupational History  . Not on file  Social Needs  . Financial resource strain: Not on file  . Food insecurity:    Worry: Not on file    Inability: Not on file  . Transportation needs:    Medical: Not on file    Non-medical: Not on file  Tobacco Use  . Smoking status: Former Games developer  . Smokeless tobacco: Never Used  Substance and Sexual Activity  . Alcohol use: No    Alcohol/week: 0.0 standard drinks  . Drug use: No  . Sexual activity: Not on file  Lifestyle  . Physical activity:    Days per week: Not on file    Minutes per session: Not on file  . Stress: Not on file  Relationships  . Social  connections:    Talks on phone: Not on file    Gets together: Not on file    Attends religious service: Not on file    Active member of club or organization: Not on file    Attends meetings of clubs or organizations: Not on file    Relationship status: Not on file  . Intimate partner violence:    Fear of current or ex partner: Not on file    Emotionally abused: Not on file    Physically abused: Not on file    Forced sexual activity: Not on file  Other Topics Concern  . Not on file  Social History Narrative  . Not on file    Family History: History reviewed. No pertinent family history.  Medications:   Current Outpatient Medications on File Prior to Visit  Medication Sig Dispense Refill  . atorvastatin (LIPITOR) 40 MG tablet TAKE 1 TABLET (40 MG TOTAL) BY MOUTH DAILY AT 6 PM.  30 tablet 0  . Calcium Carb-Cholecalciferol (CALCIUM 500+D3 PO) Take 1 tablet by mouth daily.     . Cholecalciferol (VITAMIN D3) 1000 units CAPS Take 1,000 Units by mouth at bedtime.    . clopidogrel (PLAVIX) 75 MG tablet Take 1 tablet (75 mg total) by mouth daily. 90 tablet 4  . Dextromethorphan HBr (VICKS DAYQUIL COUGH) 15 MG/15ML LIQD Take by mouth.    . hydrochlorothiazide (HYDRODIURIL) 25 MG tablet Take 25 mg by mouth daily.     Marland Kitchen levothyroxine (SYNTHROID, LEVOTHROID) 25 MCG tablet TAKE 1 TABLET (25 MCG TOTAL) BY MOUTH DAILY AT 0600.    Marland Kitchen loratadine (CLARITIN) 10 MG tablet Take 10 mg by mouth daily.  11  . montelukast (SINGULAIR) 10 MG tablet Take 10 mg by mouth at bedtime.  11  . Probiotic Product (PROBIOTIC DAILY PO) Take 1 capsule by mouth daily.     . tamsulosin (FLOMAX) 0.4 MG CAPS capsule TAKE 1 CAPSULE BY MOUTH EVERY DAY     No current facility-administered medications on file prior to visit.     Allergies:   Allergies  Allergen Reactions  . Tetanus Toxoid, Adsorbed Other (See Comments)    Patient doesn't recall reaction  . Tetanus Toxoids Other (See Comments)    Patient doesn't recall  reaction     Physical Exam  Vitals:   04/26/18 1123  BP: 125/70  Pulse: 81  Weight: 181 lb (82.1 kg)  Height: 6' (1.829 m)   Body mass index is 24.55 kg/m. No exam data present  General: well developed, pleasant elderly African-American male, well nourished, seated, in no evident distress Head: head normocephalic and atraumatic.   Neck: supple with no carotid or supraclavicular bruits Cardiovascular: regular rate and rhythm, no murmurs Musculoskeletal: no deformity Skin:  no rash/petichiae Vascular:  Normal pulses all extremities  Neurologic Exam Mental Status: Awake and fully alert. Oriented to place and time. Recent and remote memory intact. Attention span, concentration and fund of knowledge appropriate. Mood and affect appropriate.  Cranial Nerves: Pupils equal, briskly reactive to light. Decreased vision due to macular degeneration . Hard of hearing bilaterally. facial sensation intact. Face, tongue, palate moves normally and symmetrically.  Motor: Normal bulk and tone. Normal strength in all tested extremity muscles. Sensory.: intact to touch , pinprick , position and vibratory sensation.  Coordination: Rapid alternating movements normal in all extremities. Finger-to-nose and heel-to-shin performed accurately bilaterally. Gait and Station: Arises from chair without difficulty. Stance is normal. Gait demonstrates normal stride length and balance with assistance of cane.   Reflexes: 1+ and symmetric. Toes downgoing.      ASSESSMENT: Christian [name] is a 85 y.o. year old male here with multiple left subcortical and periventricular punctate infarcts on 06/19/2017 secondary to likely an emboli from left ICA stenosis with extensive plaque. Vascular risk factors include bilateral carotid stenosis, HTN, DM and HLD.  He is being seen today for follow-up visit and continues to be stable from a neurological standpoint without residual stroke deficits or reoccurring/new  symptoms.   PLAN: -Continue clopidogrel 75 mg daily  and Lipitor for secondary stroke prevention -F/u with PCP regarding your HLD and HTN management -continue to monitor BP at home -Follow-up with vascular surgery around 01/2019 for ongoing surveillance monitoring of bilateral carotid stenosis -Advised to continue to stay active as tolerated and maintain a healthy diet -Maintain strict control of hypertension with blood pressure goal between 1 30-1 50, diabetes with hemoglobin A1c goal below 6.5% and cholesterol with  LDL cholesterol (bad cholesterol) goal below 70 mg/dL. I also advised the patient to eat a healthy diet with plenty of whole grains, cereals, fruits and vegetables, exercise regularly and maintain ideal body weight.  As he has been stable from a stroke standpoint, he was advised to follow-up as needed in regards to stroke or call with questions, concerns or need of future follow-up appointment  Greater than 50% of time during this 25 minute visit was spent on counseling,explanation of diagnosis of multiple infarcts, reviewing risk factor management of HLD, HTN and stenosis, planning of further management, discussion with patient and family and coordination of care  George Hugh, [facility]  [facility] [address] [place], [place] [address]  Phone [phone] Fax [phone]

## 2018-06-16 ENCOUNTER — Other Ambulatory Visit: Payer: Self-pay

## 2018-06-16 ENCOUNTER — Encounter (INDEPENDENT_AMBULATORY_CARE_PROVIDER_SITE_OTHER): Payer: Medicare Other | Admitting: Ophthalmology

## 2018-06-16 DIAGNOSIS — I10 Essential (primary) hypertension: Secondary | ICD-10-CM

## 2018-06-16 DIAGNOSIS — H43813 Vitreous degeneration, bilateral: Secondary | ICD-10-CM

## 2018-06-16 DIAGNOSIS — H35033 Hypertensive retinopathy, bilateral: Secondary | ICD-10-CM | POA: Diagnosis not present

## 2018-06-16 DIAGNOSIS — H353231 Exudative age-related macular degeneration, bilateral, with active choroidal neovascularization: Secondary | ICD-10-CM | POA: Diagnosis not present

## 2018-07-13 ENCOUNTER — Ambulatory Visit: Payer: Medicare Other | Admitting: Podiatry

## 2018-08-24 ENCOUNTER — Ambulatory Visit (INDEPENDENT_AMBULATORY_CARE_PROVIDER_SITE_OTHER): Payer: Medicare Other | Admitting: Podiatry

## 2018-08-24 ENCOUNTER — Encounter: Payer: Self-pay | Admitting: Podiatry

## 2018-08-24 ENCOUNTER — Other Ambulatory Visit: Payer: Self-pay

## 2018-08-24 DIAGNOSIS — B351 Tinea unguium: Secondary | ICD-10-CM | POA: Diagnosis not present

## 2018-08-24 DIAGNOSIS — M79675 Pain in left toe(s): Secondary | ICD-10-CM | POA: Diagnosis not present

## 2018-08-24 DIAGNOSIS — D689 Coagulation defect, unspecified: Secondary | ICD-10-CM

## 2018-08-24 DIAGNOSIS — E119 Type 2 diabetes mellitus without complications: Secondary | ICD-10-CM | POA: Diagnosis not present

## 2018-08-24 DIAGNOSIS — M79674 Pain in right toe(s): Secondary | ICD-10-CM

## 2018-08-24 NOTE — Progress Notes (Signed)
Complaint:  Visit Type: Patient returns to my office for continued preventative foot care services. Complaint: Patient states" my nails have grown long and thick and become painful to walk and wear shoes" . The patient presents for preventative foot care services. Patient presents to the office with his wife.  Podiatric Exam: Vascular: dorsalis pedis and posterior tibial pulses are palpable bilateral. Capillary return is immediate. Temperature gradient is WNL. Skin turgor WNL  Sensorium: Normal Semmes Weinstein monofilament test. Normal tactile sensation bilaterally. Nail Exam: Pt has thick disfigured discolored nails with subungual debris noted bilateral entire nail hallux through fifth toenails.    No redness or infection or drainage. Ulcer Exam: There is no evidence of ulcer or pre-ulcerative changes or infection. Orthopedic Exam: Muscle tone and strength are WNL. No limitations in general ROM. No crepitus or effusions noted. Foot type and digits show no abnormalities. Bony prominences are unremarkable. Floating second toe left foot. Skin: No Porokeratosis. No infection or ulcers  Diagnosis:  Onychomycosis, , Pain in right toe, pain in left toes  Treatment & Plan Procedures and Treatment: Consent by patient was obtained for treatment procedures. The patient understood the discussion of treatment and procedures well. All questions were answered thoroughly reviewed. Debridement of mycotic and hypertrophic toenails, 1 through 5 bilateral and clearing of subungual debris. No ulceration, no infection noted.  Return Visit-Office Procedure: Patient instructed to return to the office for a follow up visit 3 months for continued evaluation and treatment.    Jerrit Horen DPM 

## 2018-09-22 ENCOUNTER — Encounter (INDEPENDENT_AMBULATORY_CARE_PROVIDER_SITE_OTHER): Payer: Medicare Other | Admitting: Ophthalmology

## 2018-09-22 ENCOUNTER — Other Ambulatory Visit: Payer: Self-pay

## 2018-09-22 DIAGNOSIS — I10 Essential (primary) hypertension: Secondary | ICD-10-CM

## 2018-09-22 DIAGNOSIS — H353231 Exudative age-related macular degeneration, bilateral, with active choroidal neovascularization: Secondary | ICD-10-CM | POA: Diagnosis not present

## 2018-09-22 DIAGNOSIS — H35033 Hypertensive retinopathy, bilateral: Secondary | ICD-10-CM

## 2018-09-22 DIAGNOSIS — H43813 Vitreous degeneration, bilateral: Secondary | ICD-10-CM | POA: Diagnosis not present

## 2018-11-23 ENCOUNTER — Encounter: Payer: Self-pay | Admitting: Podiatry

## 2018-11-23 ENCOUNTER — Other Ambulatory Visit: Payer: Self-pay

## 2018-11-23 ENCOUNTER — Ambulatory Visit (INDEPENDENT_AMBULATORY_CARE_PROVIDER_SITE_OTHER): Payer: Medicare Other | Admitting: Podiatry

## 2018-11-23 DIAGNOSIS — M79674 Pain in right toe(s): Secondary | ICD-10-CM | POA: Diagnosis not present

## 2018-11-23 DIAGNOSIS — M79675 Pain in left toe(s): Secondary | ICD-10-CM | POA: Diagnosis not present

## 2018-11-23 DIAGNOSIS — B351 Tinea unguium: Secondary | ICD-10-CM | POA: Diagnosis not present

## 2018-11-23 DIAGNOSIS — D689 Coagulation defect, unspecified: Secondary | ICD-10-CM

## 2018-11-23 NOTE — Progress Notes (Signed)
Complaint:  Visit Type: Patient returns to my office for continued preventative foot care services. Complaint: Patient states" my nails have grown long and thick and become painful to walk and wear shoes" . The patient presents for preventative foot care services. Patient presents to the office with his wife.  Podiatric Exam: Vascular: dorsalis pedis and posterior tibial pulses are palpable bilateral. Capillary return is immediate. Temperature gradient is WNL. Skin turgor WNL  Sensorium: Normal Semmes Weinstein monofilament test. Normal tactile sensation bilaterally. Nail Exam: Pt has thick disfigured discolored nails with subungual debris noted bilateral entire nail hallux through fifth toenails.    No redness or infection or drainage. Ulcer Exam: There is no evidence of ulcer or pre-ulcerative changes or infection. Orthopedic Exam: Muscle tone and strength are WNL. No limitations in general ROM. No crepitus or effusions noted. Foot type and digits show no abnormalities. Bony prominences are unremarkable. Floating second toe left foot. Skin: No Porokeratosis. No infection or ulcers  Diagnosis:  Onychomycosis, , Pain in right toe, pain in left toes  Treatment & Plan Procedures and Treatment: Consent by patient was obtained for treatment procedures. The patient understood the discussion of treatment and procedures well. All questions were answered thoroughly reviewed. Debridement of mycotic and hypertrophic toenails, 1 through 5 bilateral and clearing of subungual debris. No ulceration, no infection noted.  Return Visit-Office Procedure: Patient instructed to return to the office for a follow up visit 3 months for continued evaluation and treatment.    Tanasia Budzinski DPM 

## 2018-12-30 ENCOUNTER — Encounter (INDEPENDENT_AMBULATORY_CARE_PROVIDER_SITE_OTHER): Payer: Medicare Other | Admitting: Ophthalmology

## 2018-12-30 ENCOUNTER — Other Ambulatory Visit: Payer: Self-pay

## 2018-12-30 DIAGNOSIS — H353231 Exudative age-related macular degeneration, bilateral, with active choroidal neovascularization: Secondary | ICD-10-CM

## 2018-12-30 DIAGNOSIS — H35033 Hypertensive retinopathy, bilateral: Secondary | ICD-10-CM | POA: Diagnosis not present

## 2018-12-30 DIAGNOSIS — I10 Essential (primary) hypertension: Secondary | ICD-10-CM

## 2018-12-30 DIAGNOSIS — H43813 Vitreous degeneration, bilateral: Secondary | ICD-10-CM | POA: Diagnosis not present

## 2019-01-19 ENCOUNTER — Other Ambulatory Visit: Payer: Self-pay | Admitting: Adult Health

## 2019-01-19 DIAGNOSIS — I6523 Occlusion and stenosis of bilateral carotid arteries: Secondary | ICD-10-CM

## 2019-01-28 ENCOUNTER — Other Ambulatory Visit: Payer: Self-pay | Admitting: Adult Health

## 2019-01-28 DIAGNOSIS — I6523 Occlusion and stenosis of bilateral carotid arteries: Secondary | ICD-10-CM

## 2019-01-31 ENCOUNTER — Other Ambulatory Visit: Payer: Self-pay | Admitting: Adult Health

## 2019-01-31 DIAGNOSIS — I6523 Occlusion and stenosis of bilateral carotid arteries: Secondary | ICD-10-CM

## 2019-02-22 ENCOUNTER — Other Ambulatory Visit: Payer: Self-pay

## 2019-02-22 ENCOUNTER — Ambulatory Visit: Payer: Medicare Other | Admitting: Podiatry

## 2019-02-22 DIAGNOSIS — I6523 Occlusion and stenosis of bilateral carotid arteries: Secondary | ICD-10-CM

## 2019-02-23 ENCOUNTER — Other Ambulatory Visit: Payer: Self-pay

## 2019-02-23 ENCOUNTER — Ambulatory Visit (HOSPITAL_COMMUNITY)
Admission: RE | Admit: 2019-02-23 | Discharge: 2019-02-23 | Disposition: A | Discharge status: Home / Self Care | Payer: Medicare Other | Source: Ambulatory Visit | Attending: Vascular Surgery | Admitting: Vascular Surgery

## 2019-02-23 ENCOUNTER — Encounter: Payer: Self-pay | Admitting: Vascular Surgery

## 2019-02-23 ENCOUNTER — Ambulatory Visit (INDEPENDENT_AMBULATORY_CARE_PROVIDER_SITE_OTHER): Payer: Medicare Other | Admitting: Vascular Surgery

## 2019-02-23 VITALS — BP 128/83 | HR 71 | Temp 97.2°F | Resp 20 | Ht 72.0 in | Wt 181.0 lb

## 2019-02-23 DIAGNOSIS — I6523 Occlusion and stenosis of bilateral carotid arteries: Secondary | ICD-10-CM | POA: Diagnosis present

## 2019-02-23 NOTE — Progress Notes (Signed)
Patient name: Christian [name] MRN: [id number] DOB: Feb 23, 1924 Sex: male  REASON FOR VISIT:   Follow-up of bilateral carotid disease.  HPI:   Christian [name] is a pleasant 85 y.o. male who had a left brain stroke associated with right-sided weakness and was found to have an occluded right internal carotid artery with a moderate left carotid stenosis.  His bifurcation is high and is not a candidate for carotid endarterectomy.  In addition he was evaluated for carotid stenting and was not a good candidate for stenting.  Fortunately on his most recent follow-up study in November 2019 the stenosis had improved and he remained asymptomatic since his previous event.  He was set up for a 1 year follow-up study.  Since I saw him last he denies any focal weakness or paresthesias.  He denies expressive or receptive aphasia or amaurosis fugax.  He is on a statin.  He was on aspirin and Plavix but the aspirin was discontinued and he remains on Plavix.  He has had no significant changes to his medical history.  Past Medical History:  Diagnosis Date  . BPH (benign prostatic hyperplasia)   . Carotid artery occlusion   . Cataract   . CKD (chronic kidney disease)   . HTN (hypertension)   . Macular degeneration (senile) of retina   . RBBB with left anterior fascicular block   . Stroke [facility])     History reviewed. No pertinent family history.  SOCIAL HISTORY: Social History   Tobacco Use  . Smoking status: Former Research scientist (life sciences)  . Smokeless tobacco: Never Used  Substance Use Topics  . Alcohol use: No    Alcohol/week: 0.0 standard drinks    Allergies  Allergen Reactions  . Tetanus Toxoid, Adsorbed Other (See Comments)    Patient doesn't recall reaction  . Tetanus Toxoids Other (See Comments)    Patient doesn't recall reaction    Current Outpatient Medications  Medication Sig Dispense Refill  . atorvastatin (LIPITOR) 40 MG tablet TAKE 1 TABLET (40 MG TOTAL) BY MOUTH DAILY AT 6 PM. 30 tablet 0  .  Calcium Carb-Cholecalciferol (CALCIUM 500+D3 PO) Take 1 tablet by mouth daily.     . Cholecalciferol (VITAMIN D3) 1000 units CAPS Take 1,000 Units by mouth at bedtime.    . clopidogrel (PLAVIX) 75 MG tablet Take 1 tablet (75 mg total) by mouth daily. Further refills should be managed by PCP. 90 tablet 0  . hydrochlorothiazide (HYDRODIURIL) 25 MG tablet Take 25 mg by mouth daily.     Marland Kitchen KLOR-CON M10 10 MEQ tablet     . levothyroxine (SYNTHROID, LEVOTHROID) 25 MCG tablet TAKE 1 TABLET (25 MCG TOTAL) BY MOUTH DAILY AT 0600.    Marland Kitchen loratadine (CLARITIN) 10 MG tablet Take 10 mg by mouth daily.  11  . montelukast (SINGULAIR) 10 MG tablet Take 10 mg by mouth at bedtime.  11  . potassium chloride (K-DUR) 10 MEQ tablet Take 10 mEq by mouth daily.    . Probiotic Product (PROBIOTIC DAILY PO) Take 1 capsule by mouth daily.     . tamsulosin (FLOMAX) 0.4 MG CAPS capsule TAKE 1 CAPSULE BY MOUTH EVERY DAY    . Dextromethorphan HBr (VICKS DAYQUIL COUGH) 15 MG/15ML LIQD Take by mouth.     No current facility-administered medications for this visit.    REVIEW OF SYSTEMS:  [X]  denotes positive finding, [ ]  denotes negative finding Cardiac  Comments:  Chest pain or chest pressure:    Shortness of breath upon  exertion:    Short of breath when lying flat:    Irregular heart rhythm:        Vascular    Pain in calf, thigh, or hip brought on by ambulation:    Pain in feet at night that wakes you up from your sleep:     Blood clot in your veins:    Leg swelling:         Pulmonary    Oxygen at home:    Productive cough:     Wheezing:         Neurologic    Sudden weakness in arms or legs:     Sudden numbness in arms or legs:     Sudden onset of difficulty speaking or slurred speech:    Temporary loss of vision in one eye:     Problems with dizziness:         Gastrointestinal    Blood in stool:     Vomited blood:         Genitourinary    Burning when urinating:     Blood in urine:        Psychiatric     Major depression:         Hematologic    Bleeding problems:    Problems with blood clotting too easily:        Skin    Rashes or ulcers:        Constitutional    Fever or chills:     PHYSICAL EXAM:   Vitals:   02/23/19 1018 02/23/19 1021  BP: 131/78 128/83  Pulse: 71   Resp: 20   Temp: (!) 97.2 F (36.2 C)   SpO2: 96%   Weight: 181 lb (82.1 kg)   Height: 6' (1.829 m)     GENERAL: The patient is a well-nourished male, in no acute distress. The vital signs are documented above. CARDIAC: There is a regular rate and rhythm.  VASCULAR: I do not detect carotid bruits. He has no significant lower extremity swelling. PULMONARY: There is good air exchange bilaterally without wheezing or rales. ABDOMEN: Soft and non-tender with normal pitched bowel sounds.  MUSCULOSKELETAL: There are no major deformities or cyanosis. NEUROLOGIC: No focal weakness or paresthesias are detected. SKIN: There are no ulcers or rashes noted. PSYCHIATRIC: The patient has a normal affect.  DATA:    CAROTID DUPLEX: I have independently interpreted his carotid duplex scan today.  He has a known right internal carotid artery occlusion.  On the left side there is a 40 to 59% stenosis.  Both vertebral arteries are patent with antegrade flow.  MEDICAL ISSUES:   BILATERAL CAROTID DISEASE: He has the known right internal carotid artery occlusion.  On the left side he has a stable 40 to 59% carotid stenosis.  Both vertebral arteries are patent with antegrade flow.  Overall he is doing well and has had no new symptoms.  He is being treated with maximal medical therapy including a statin and Plavix.  I have ordered a follow-up carotid duplex scan in 1 year and I will see him back at that time.  He knows to call sooner if he has problems.  I encouraged him to stay as active as possible.  He seems to be doing very well and just celebrated his 89rd birthday.  Waverly Ferrari [facility]  [facility] [phone]

## 2019-03-10 ENCOUNTER — Other Ambulatory Visit: Payer: Self-pay | Admitting: *Deleted

## 2019-03-10 DIAGNOSIS — I6523 Occlusion and stenosis of bilateral carotid arteries: Secondary | ICD-10-CM

## 2019-03-17 ENCOUNTER — Other Ambulatory Visit (HOSPITAL_COMMUNITY): Payer: Self-pay | Admitting: Family Medicine

## 2019-03-17 ENCOUNTER — Other Ambulatory Visit: Payer: Self-pay | Admitting: Family Medicine

## 2019-03-17 DIAGNOSIS — F0390 Unspecified dementia without behavioral disturbance: Secondary | ICD-10-CM

## 2019-03-24 ENCOUNTER — Other Ambulatory Visit: Payer: Self-pay

## 2019-03-24 ENCOUNTER — Ambulatory Visit (HOSPITAL_COMMUNITY)
Admission: RE | Admit: 2019-03-24 | Discharge: 2019-03-24 | Disposition: A | Discharge status: Home / Self Care | Payer: Medicare Other | Source: Ambulatory Visit | Attending: Family Medicine | Admitting: Family Medicine

## 2019-03-24 DIAGNOSIS — F0390 Unspecified dementia without behavioral disturbance: Secondary | ICD-10-CM | POA: Diagnosis present

## 2019-04-07 ENCOUNTER — Encounter (INDEPENDENT_AMBULATORY_CARE_PROVIDER_SITE_OTHER): Payer: Medicare Other | Admitting: Ophthalmology

## 2019-04-07 ENCOUNTER — Other Ambulatory Visit: Payer: Self-pay

## 2019-04-07 DIAGNOSIS — H353231 Exudative age-related macular degeneration, bilateral, with active choroidal neovascularization: Secondary | ICD-10-CM | POA: Diagnosis not present

## 2019-04-07 DIAGNOSIS — I10 Essential (primary) hypertension: Secondary | ICD-10-CM | POA: Diagnosis not present

## 2019-04-07 DIAGNOSIS — H43813 Vitreous degeneration, bilateral: Secondary | ICD-10-CM | POA: Diagnosis not present

## 2019-04-07 DIAGNOSIS — H35033 Hypertensive retinopathy, bilateral: Secondary | ICD-10-CM

## 2019-04-13 ENCOUNTER — Ambulatory Visit (INDEPENDENT_AMBULATORY_CARE_PROVIDER_SITE_OTHER): Payer: Medicare Other | Admitting: Podiatry

## 2019-04-13 ENCOUNTER — Encounter: Payer: Self-pay | Admitting: Podiatry

## 2019-04-13 ENCOUNTER — Other Ambulatory Visit: Payer: Self-pay

## 2019-04-13 DIAGNOSIS — B351 Tinea unguium: Secondary | ICD-10-CM

## 2019-04-13 DIAGNOSIS — M79675 Pain in left toe(s): Secondary | ICD-10-CM | POA: Diagnosis not present

## 2019-04-13 DIAGNOSIS — D689 Coagulation defect, unspecified: Secondary | ICD-10-CM

## 2019-04-13 DIAGNOSIS — E119 Type 2 diabetes mellitus without complications: Secondary | ICD-10-CM

## 2019-04-13 DIAGNOSIS — M79674 Pain in right toe(s): Secondary | ICD-10-CM

## 2019-04-13 NOTE — Progress Notes (Signed)
Complaint:  Visit Type: Patient returns to my office for continued preventative foot care services. Complaint: Patient states" my nails have grown long and thick and become painful to walk and wear shoes" . The patient presents for preventative foot care services. Patient presents to the office with his wife.  Podiatric Exam: Vascular: dorsalis pedis and posterior tibial pulses are palpable bilateral. Capillary return is immediate. Temperature gradient is WNL. Skin turgor WNL  Sensorium: Normal Semmes Weinstein monofilament test. Normal tactile sensation bilaterally. Nail Exam: Pt has thick disfigured discolored nails with subungual debris noted bilateral entire nail hallux through fifth toenails.    No redness or infection or drainage. Ulcer Exam: There is no evidence of ulcer or pre-ulcerative changes or infection. Orthopedic Exam: Muscle tone and strength are WNL. No limitations in general ROM. No crepitus or effusions noted. Foot type and digits show no abnormalities. Bony prominences are unremarkable. Floating second toe left foot. Skin: No Porokeratosis. No infection or ulcers  Diagnosis:  Onychomycosis, , Pain in right toe, pain in left toes  Treatment & Plan Procedures and Treatment: Consent by patient was obtained for treatment procedures. The patient understood the discussion of treatment and procedures well. All questions were answered thoroughly reviewed. Debridement of mycotic and hypertrophic toenails, 1 through 5 bilateral and clearing of subungual debris. No ulceration, no infection noted.  Return Visit-Office Procedure: Patient instructed to return to the office for a follow up visit 3 months for continued evaluation and treatment.    Helane Gunther DPM

## 2019-05-20 ENCOUNTER — Telehealth: Payer: Self-pay | Admitting: Internal Medicine

## 2019-05-20 NOTE — Telephone Encounter (Signed)
Scheduled [facility] Palliative visit for 05-24-19 at 11:00.

## 2019-05-24 ENCOUNTER — Other Ambulatory Visit: Payer: Self-pay

## 2019-05-24 ENCOUNTER — Other Ambulatory Visit: Payer: Medicare Other | Admitting: Internal Medicine

## 2019-05-24 ENCOUNTER — Encounter: Payer: Self-pay | Admitting: Internal Medicine

## 2019-05-24 DIAGNOSIS — Z7189 Other specified counseling: Secondary | ICD-10-CM

## 2019-05-24 DIAGNOSIS — Z515 Encounter for palliative care: Secondary | ICD-10-CM

## 2019-05-24 NOTE — Progress Notes (Signed)
March 23rd, 2021 [facility] Palliative Care Consult Note Telephone: [phone]  Fax: [phone]  PATIENT NAME: Christian [name] DOB: 1923-12-15 MRN: [id number], [place] [address] [phone]  PRIMARY CARE PROVIDER:   Frederich [name]., MD f/u March 29th  REFERRING PROVIDER:  Frederich [name]., MD  [address] [place],  [place] [address] [facility] [phone]  RESPONSIBLE PARTY: (spouse) Christian [phone], [phone]  ASSESSMENT / RECOMMENDATIONS:  1. Advance Care Planning: A. Directives: DNR. MOST: Comfort. Yes to Antibiotics/IVFs, no to tube feeding. B. Goals of Care: Spouse wants to keep her husband home No nursing home for him!. We discussed the progressive nature of the brain disease that is Alzheimers. Spouse would like referral to hospice services. I am awaiting confirmation call back from patient's PCP Dr. Malen [name], to check if okay to proceed with referral.   2. Cognitive / Functional status: FAST 6e-7a. Poor vision and hearing. Constantly confused. Recognizes spouse. Spouse notes a rapid decline over the last 3 months.  -Two months earlier spouse noted decline in short term memory. No consistently cant remember events moment. Progressive apathy so now tends to stare out into space or nap, if not directly engaged. Can speak few word sentences, follow simple commands. No longer initiating a conversation. Increasingly irritable and angry episodes; spouse makes sure that she has an escape route in case he were to lash out physically at her (has not as of yet). He is resistant to taking his medications. Spouse tries to keep a calm atmosphere during those times. Notes calming effect of Aricept 5mg . Asking to increased dose to bid. Haldol on hand and seems to help as well. Ordered for 1mg  qhs. He is an elopement risk; last week mentioned he was going to go outside (early am) opened the door then changed his  mind as it was too dark.  -Iincrease Aricept to 5mg  bid  -Increase Haldol 1mg  to q4h prn agitation.  -discussed with spouse installing sliding lock to prevent wandering outside of the home. - Two years earlier mowing lawn with tractor. Two months earlier ambulating about outside with cane. Progressive weakness so that now ambulates with rolling rollator / shuffling gait. Can occasionally use cane to ambulate in home short distances. Can rock himself up to a standing position, but now often needs his wifes assist to assist to transfer. about 1 month ago (tripped); difficult to stand. -Patient can stand to use urinal at night, but over last 3 weeks is increasingly incontinent. Spouse notes urinary hesitancy. He has been refusing to take his Flomax d/t larger capsule.  Needs assist with toileting and 100% assist with bathing and dressing.   -Try to give Flomax with yogurt/pudding  -Washable cloth under pads to ease caregiving  clean ups nocturnal incontinence. -1 month earlier great oral intake, but over the last month consuming only bites to 25% of one meal a day. More often needing to be fed.  Spouse notes patient retaining fluid in his mouth, needing reminding to swallow, and some coughing with solids. Able to drink fluid from cup with straw independently. Weight last week was 171lbs, down 10lbs over 6weeks. At height of his BMI is 23.2kg/m2.   -Problems with insomnia/nocturnal restlessness (gets up and rearranges clothing/furniture). Naps during the day. Wont take his medication. Gets to sleep at 9, awakens at 12 up till 4am, rearranges clothing/furniture.  -Sleepy time tea.  -Melatonin 3mg  qhs  3. Family Supports:  Married for 6 years to [name]. 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 His daughter [name] works in a elderly residential home. She stops by about twice a week to assist her dad. Plans to assist more after she retires in about a  month or so. Joyces daughter lives in [name] and helps as she is able. Another child in [name], one in [name]. Spouse grieves loss of her spouse to dementia. She finds strength in the lord.  Strength in lord. Tries not to cry. Sings love songs to him. 2 x/weeks. plans to visit more frequently after she retires in one month.  4. Follow up Palliative Care Visit: in home visit Monday 06/13/2019 @ 11am  I spent 90 minutes providing this consultation from 11am-12:30am. More than 50% of the time in this consultation was spent coordinating communication.   HISTORY OF PRESENT ILLNESS:  [name] is a 86 y.o. male with senile dementia with behaviors. H/o stroke, CKD, HTN, carotid artery occlusion, BPH.  Palliative Care was asked to help address goals of care.   CODE STATUS: DNR  PPS: 30%-weak 40% HOSPICE ELIGIBILITY/DIAGNOSIS: Yes/Cerebral Vascular disease with progressive functional and cognitive decline.  PAST MEDICAL HISTORY:  Past Medical History:  Diagnosis Date   BPH (benign prostatic hyperplasia)    Carotid artery occlusion    Cataract    CKD (chronic kidney disease)    HTN (hypertension)    Macular degeneration (senile) of retina    RBBB with left anterior fascicular block    Stroke (HCC)     SOCIAL HX:  Social History   Tobacco Use   Smoking status: Former Smoker   Smokeless tobacco: Never Used  Substance Use Topics   Alcohol use: No    Alcohol/week: 0.0 standard drinks    ALLERGIES:  Allergies  Allergen Reactions   Tetanus Toxoid, Adsorbed Other (See Comments)    Patient doesn't recall reaction   Tetanus Toxoids Other (See Comments)    Patient doesn't recall reaction     PERTINENT MEDICATIONS:  Outpatient Encounter Medications as of 05/24/2019  Medication Sig   atorvastatin (LIPITOR) 40 MG tablet TAKE 1 TABLET (40 MG TOTAL) BY MOUTH DAILY AT 6 PM.   Calcium Carb-Cholecalciferol (CALCIUM 500+D3 PO) Take 1 tablet by mouth daily.    Cholecalciferol  (VITAMIN D3) 1000 units CAPS Take 1,000 Units by mouth at bedtime.   clopidogrel (PLAVIX) 75 MG tablet Take 1 tablet (75 mg total) by mouth daily. Further refills should be managed by PCP.   Dextromethorphan HBr (VICKS DAYQUIL COUGH) 15 MG/15ML LIQD Take by mouth.   hydrochlorothiazide (HYDRODIURIL) 25 MG tablet Take 25 mg by mouth daily.    KLOR-CON M10 10 MEQ tablet    levothyroxine (SYNTHROID, LEVOTHROID) 25 MCG tablet TAKE 1 TABLET (25 MCG TOTAL) BY MOUTH DAILY AT 0600.   loratadine (CLARITIN) 10 MG tablet Take 10 mg by mouth daily.   montelukast (SINGULAIR) 10 MG tablet Take 10 mg by mouth at bedtime.   potassium chloride (K-DUR) 10 MEQ tablet Take 10 mEq by mouth daily.   Probiotic Product (PROBIOTIC DAILY PO) Take 1 capsule by mouth daily.    tamsulosin (FLOMAX) 0.4 MG CAPS capsule TAKE 1 CAPSULE BY MOUTH EVERY DAY   No facility-administered encounter medications on file as of 05/24/2019.    PHYSICAL EXAM:   General: Slender, elderly male with flat affect. Follows simple commands, and answers short questions appropriately.Shuffles with  walker with slow gait. Pleasant affect, slow of speech  PE abbreviated d/t COVID precautions/distancing. Extremities: no edema, no joint deformities Skin: no rashes Neurological: Weakness but otherwise nonfocal  Julianne Handler, NP

## 2019-07-02 DEATH — deceased

## 2019-07-13 ENCOUNTER — Ambulatory Visit: Payer: Medicare Other | Admitting: Podiatry

## 2019-07-21 ENCOUNTER — Encounter (INDEPENDENT_AMBULATORY_CARE_PROVIDER_SITE_OTHER): Payer: Medicare Other | Admitting: Ophthalmology

## 2020-02-10 ENCOUNTER — Telehealth: Payer: Self-pay | Admitting: Vascular Surgery

## 2020-04-12 IMAGING — CT CT ANGIO HEAD
1 of 9 series · 5 of 33 positions shown · IV contrast (APPLIED)
Comparison: Head MRI and MRA 06/20/2017

CLINICAL DATA: Follow-up stroke. Small acute left cerebral
hemispheric infarcts on MRI. Occluded right ICA on MRA.

EXAM:
CT ANGIOGRAPHY HEAD AND NECK
TECHNIQUE: Multidetector CT imaging of the head and neck was performed using
the standard protocol during bolus administration of intravenous
contrast. Multiplanar CT image reconstructions and MIPs were
obtained to evaluate the vascular anatomy. Carotid stenosis
measurements (when applicable) are obtained utilizing NASCET
criteria, using the distal internal carotid diameter as the
denominator.
CONTRAST:  50mL USSBYH-F79 IOPAMIDOL ([id number]-F79) INJECTION 76%

[Series 7: ax thins · axial · 0.50mm/px · z∈[-294,-60]mm · 5 of 352 slices shown]
[im 59/352  soft-tissue]
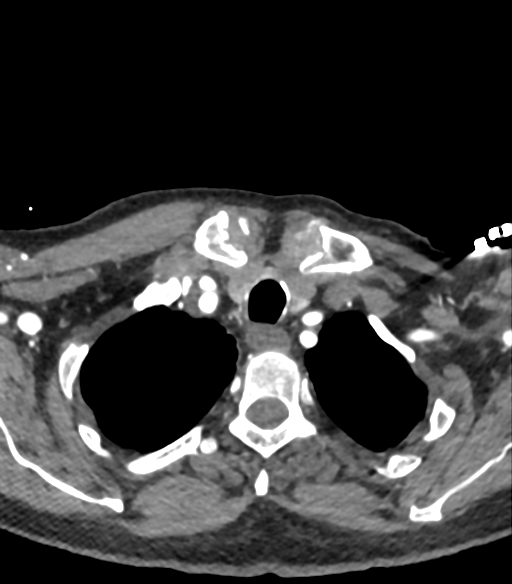
[im 118/352  bone]
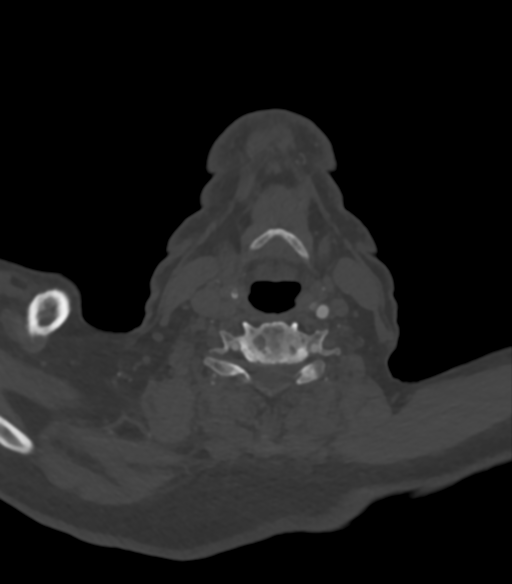
[im 176/352  soft-tissue]
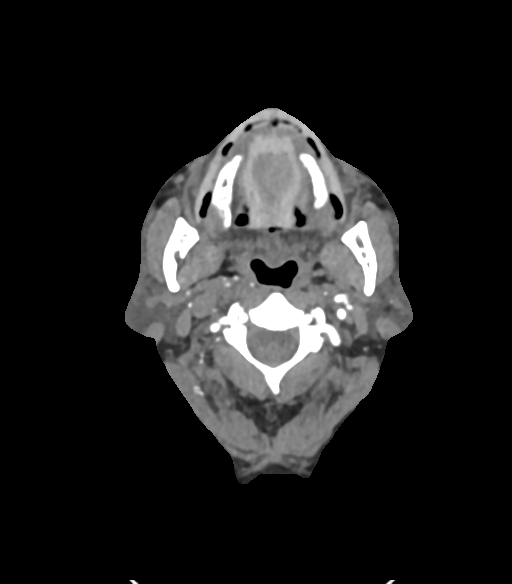
[im 235/352  bone]
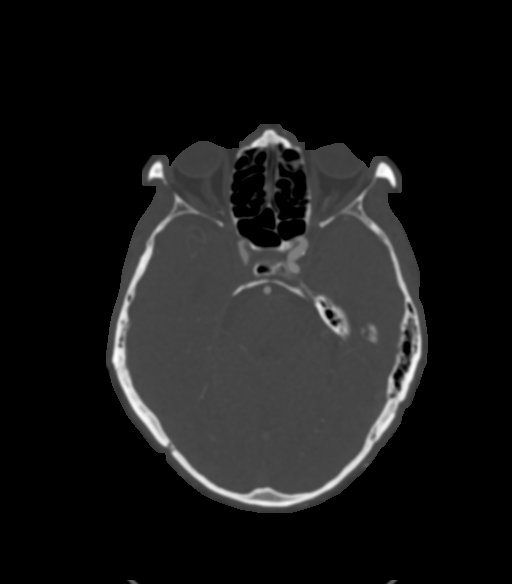
[im 293/352  soft-tissue]
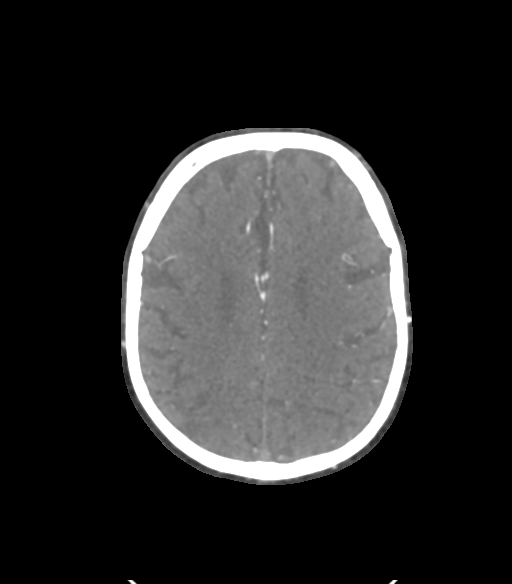

[5 of 33 positions shown; findings below may reference images not displayed]

FINDINGS: CTA NECK FINDINGS

Aortic arch: Standard 3 vessel aortic arch with mild arch
atherosclerosis. Moderate calcified plaque at the right subclavian
artery origin without significant stenosis.

Right carotid system: Common carotid artery occlusion just beyond
its origin. Reconstitution of small and asymmetrically
underopacified internal and external carotid arteries at the
bifurcation.

Left carotid system: Patent with bulky calcified plaque at the
carotid bifurcation resulting in 60% stenosis of the distal common
carotid artery and less than 50% stenosis of the ICA origin.
Tortuous mid cervical ICA.

Vertebral arteries: Patent and codominant without evidence of
significant stenosis or dissection.

Skeleton: Severe disc degeneration from C3-4 to C7-T1. C5-6 and C6-7
intervertebral ankylosis. Advanced cervical facet arthrosis.

Other neck: Bilateral thyroid nodules measuring up to 1.3 cm on the
left.

Upper chest: Moderate centrilobular emphysema.

Review of the MIP images confirms the above findings

CTA HEAD FINDINGS

Anterior circulation: The intracranial internal carotid arteries are
patent with the right being diffusely small. Minimal nonstenotic
plaque is noted in the left cavernous ICA. There is an infundibulum
at the right posterior communicating artery origin. ACAs and MCAs
are patent without evidence of proximal branch occlusion or
significant proximal stenosis. No aneurysm.

Posterior circulation: The intracranial vertebral arteries are
widely patent to the basilar. Patent right PICA, bilateral AICA, and
bilateral SCA origins are identified. The left PICA appears to arise
from the proximal basilar artery. The basilar artery is widely
patent. There are right larger than left posterior communicating
arteries. PCAs are patent without evidence of significant stenosis.
No aneurysm.

Venous sinuses: Patent.

Anatomic variants: None.

Delayed phase: No abnormal enhancement.

Review of the MIP images confirms the above findings
IMPRESSION: 1. Right common carotid artery occlusion with distal reconstitution
of the internal and external carotid arteries.
2. 60% distal left common carotid artery stenosis due to bulky
calcified plaque.
3. Widely patent vertebral arteries.
4. Patent circle-of-Willis without proximal branch occlusion or flow
limiting proximal stenosis.
5. Aortic Atherosclerosis ([id number]) and Emphysema ([id number].P).

## 2020-09-04 ENCOUNTER — Other Ambulatory Visit: Payer: Self-pay | Admitting: Vascular Surgery

## 2020-09-04 DIAGNOSIS — I6523 Occlusion and stenosis of bilateral carotid arteries: Secondary | ICD-10-CM

## 2022-01-14 IMAGING — CT CT HEAD W/O CM
3 series · 16 of 47 positions shown, 19 images · non-contrast
Comparison: CT 06/19/2017

CLINICAL DATA: History of left brain stroke

EXAM:
CT HEAD WITHOUT CONTRAST
TECHNIQUE: Contiguous axial images were obtained from the base of the skull
through the vertex without intravenous contrast.

[Series 2: head wo · axial · 0.43mm/px · z∈[-120,+5]mm · 10 of 30 slices shown, 13 images]
[im 3/30  brain]
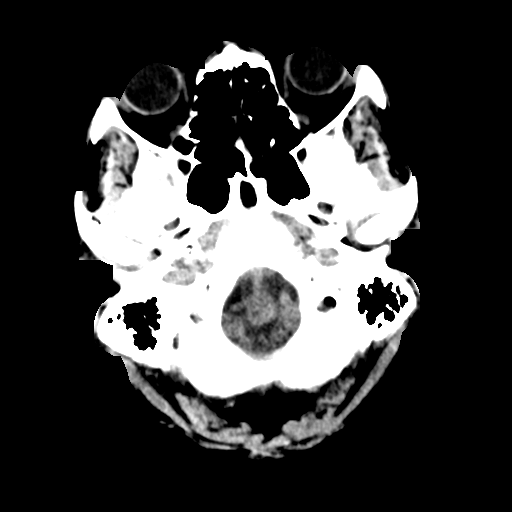
[im 3/30  bone]
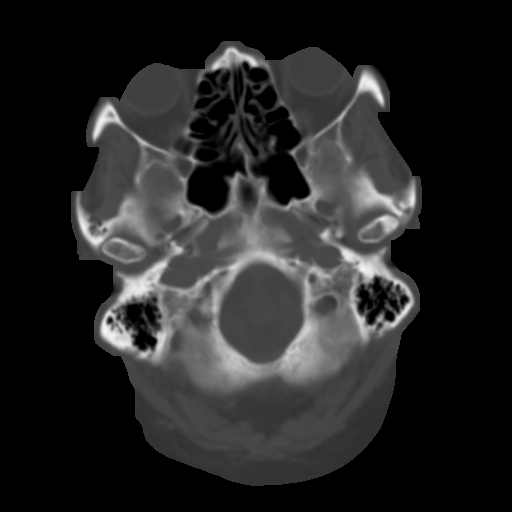
[im 6/30  brain]
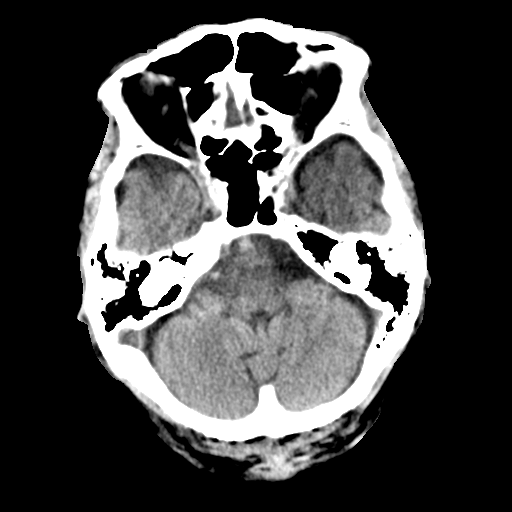
[im 9/30  brain]
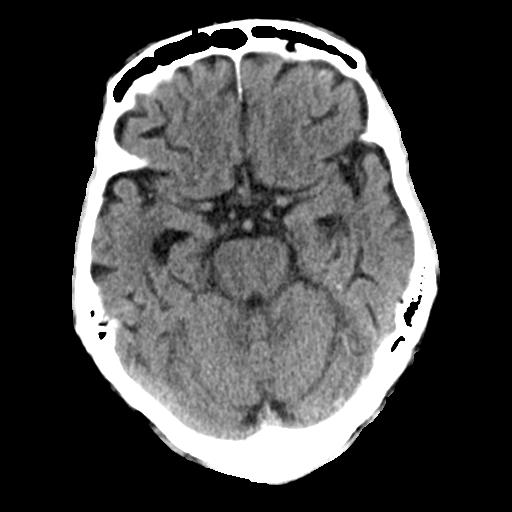
[im 11/30  brain]
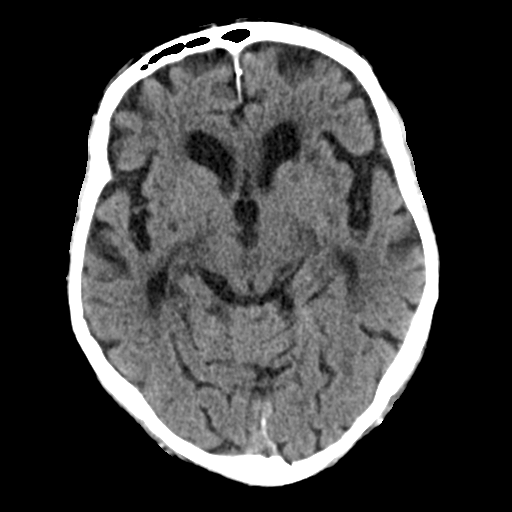
[im 14/30  brain]
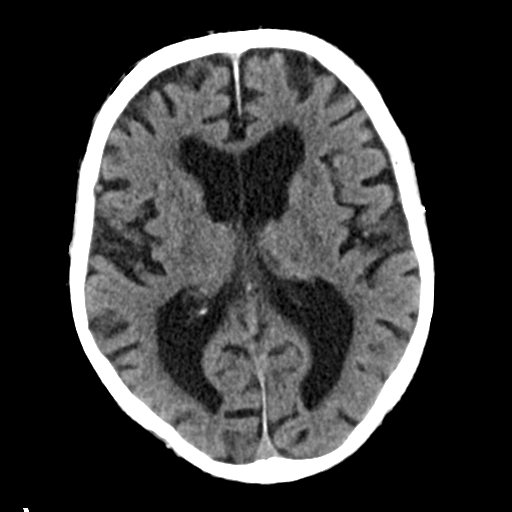
[im 14/30  bone]
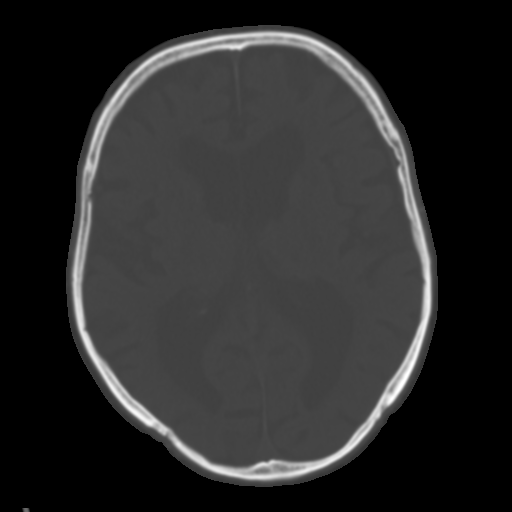
[im 17/30  brain]
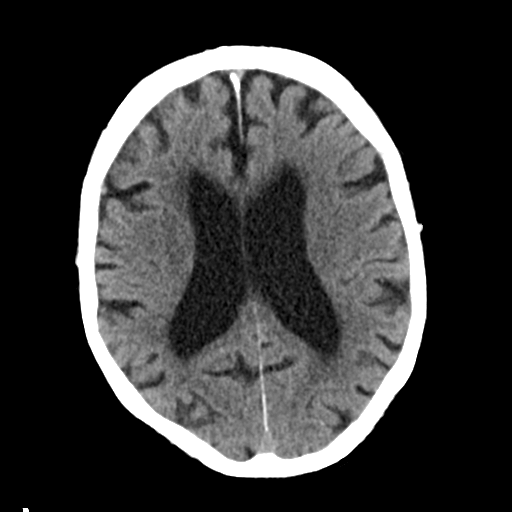
[im 20/30  brain]
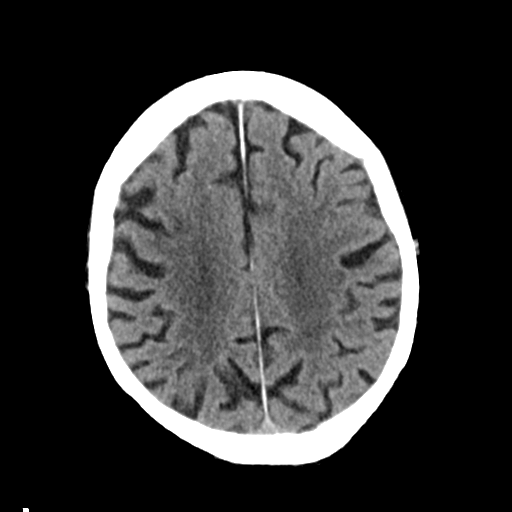
[im 23/30  brain]
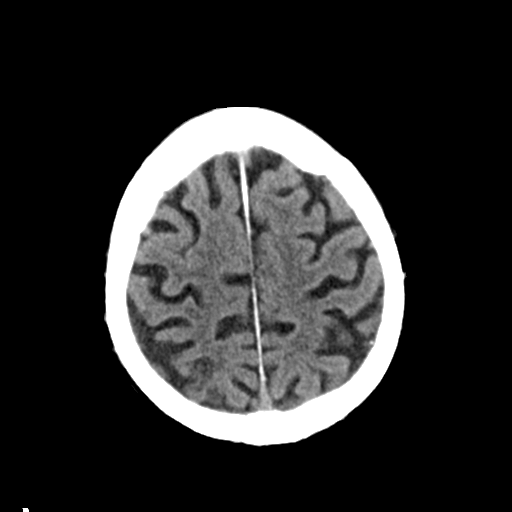
[im 25/30  brain]
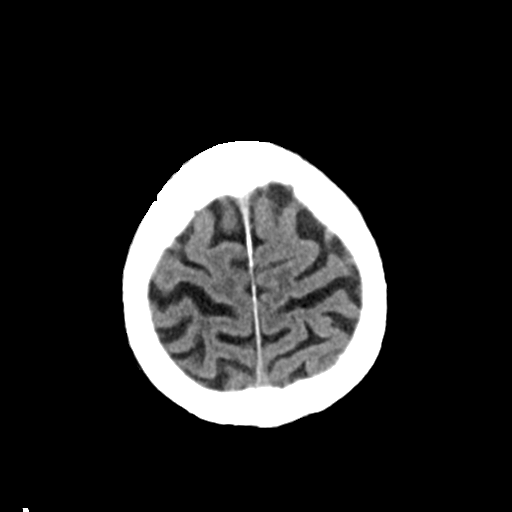
[im 25/30  bone]
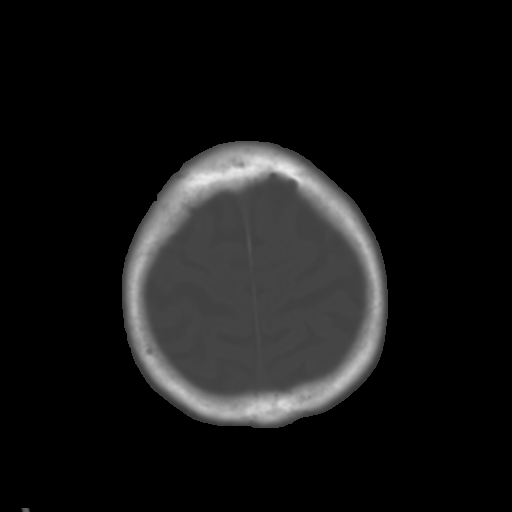
[im 28/30  brain]
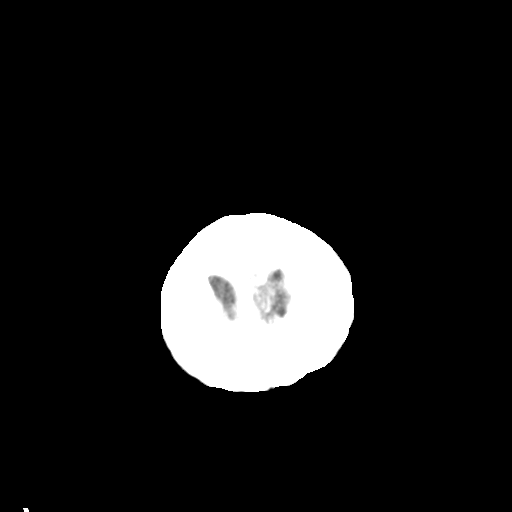

[Series 4: coronal soft tissue · coronal · 0.32mm/px · 3 of 69 slices shown]
[im 23/69  brain]
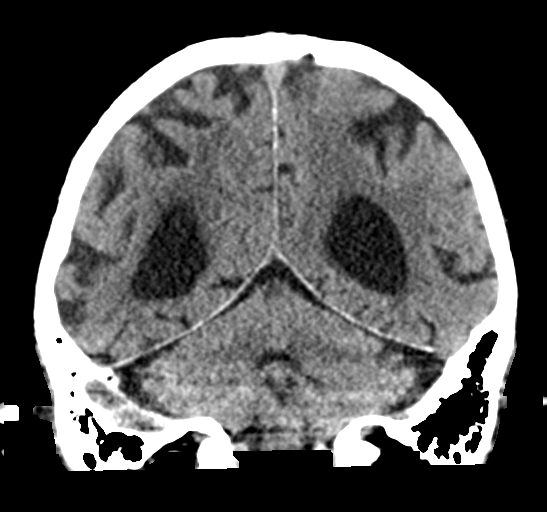
[im 31/69  brain]
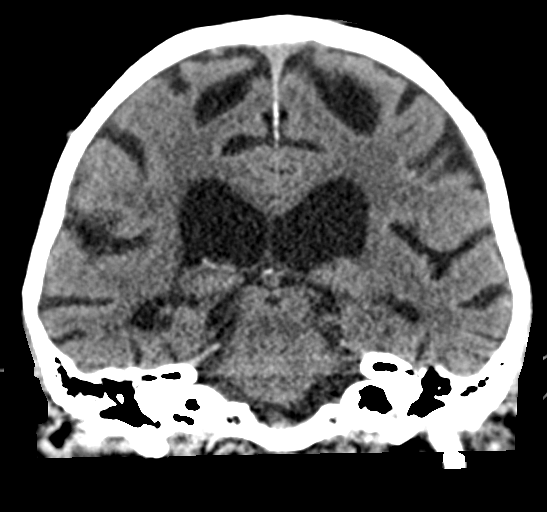
[im 38/69  brain]
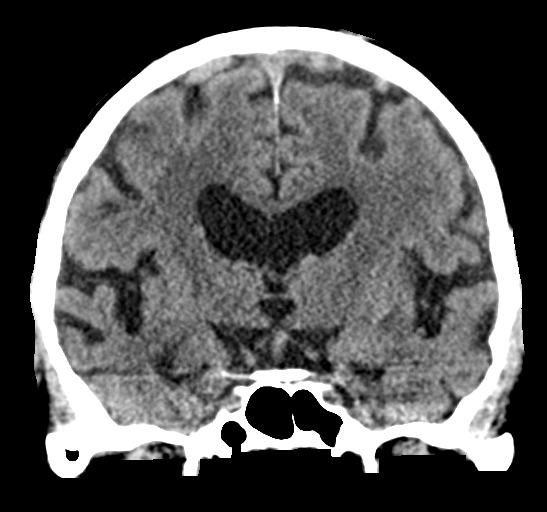

[Series 5: sagittal soft tissue · sagittal · 0.32mm/px · 3 of 56 slices shown]
[im 19/56  brain]
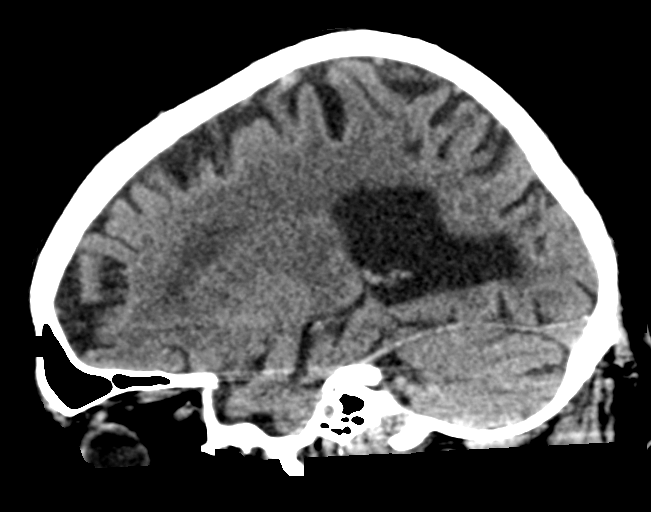
[im 28/56  brain]
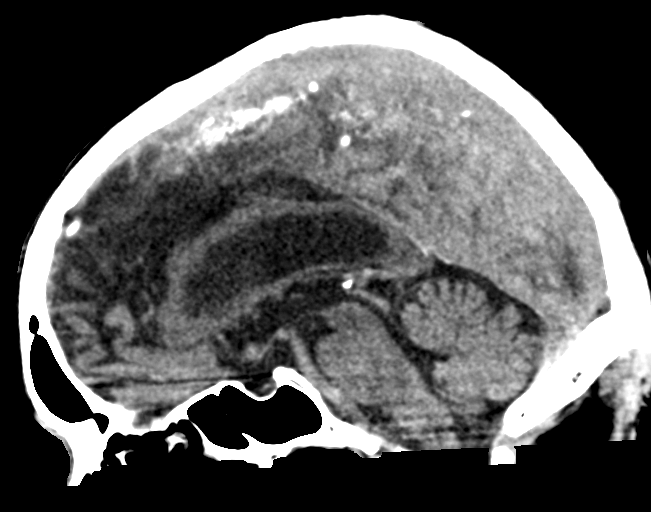
[im 37/56  brain]
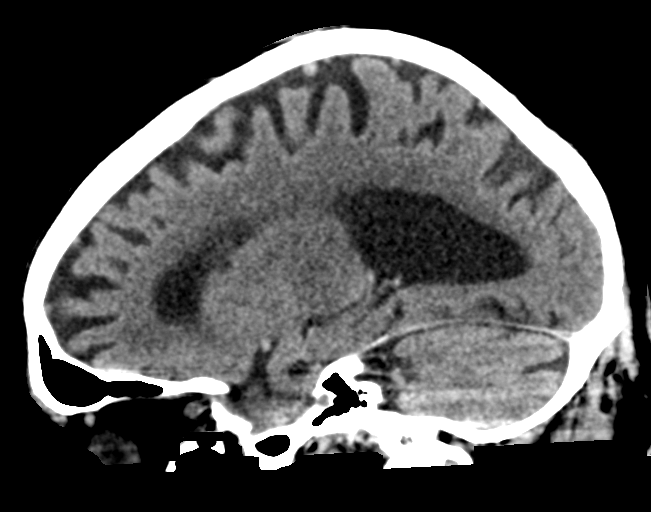

[16 of 47 positions shown; findings below may reference images not displayed]

FINDINGS: Brain: No acute territorial infarction, hemorrhage, or intracranial
mass. Chronic lacunar infarcts in the left basal ganglia. Minimal
hypodensity in the white matter consistent with chronic small vessel
ischemic change. Moderate atrophy. Stable ventricular enlargement,
likely secondary to atrophy.

Vascular: No hyperdense vessels. Scattered calcifications at the
carotid siphons.

Skull: Normal. Negative for fracture or focal lesion.

Sinuses/Orbits: No acute finding.

Other: None
IMPRESSION: 1. No CT evidence for acute intracranial abnormality.
2. Chronic lacunar infarcts in the left basal ganglia.
3. Atrophy and minimal small vessel ischemic change of the white
matter
